# Patient Record
Sex: Male | Born: 1985 | Race: White | Hispanic: No | Marital: Single | Smoking: Current every day smoker
Health system: Southern US, Community
[De-identification: ages and names within clinical notes are randomized; demographics above are authoritative.]

## PROBLEM LIST (undated history)

## (undated) DIAGNOSIS — J45909 Unspecified asthma, uncomplicated: Secondary | ICD-10-CM

## (undated) DIAGNOSIS — R768 Other specified abnormal immunological findings in serum: Secondary | ICD-10-CM

## (undated) DIAGNOSIS — R413 Other amnesia: Secondary | ICD-10-CM

## (undated) DIAGNOSIS — S069XAA Unspecified intracranial injury with loss of consciousness status unknown, initial encounter: Secondary | ICD-10-CM

## (undated) DIAGNOSIS — G9389 Other specified disorders of brain: Secondary | ICD-10-CM

## (undated) DIAGNOSIS — Z87820 Personal history of traumatic brain injury: Secondary | ICD-10-CM

## (undated) DIAGNOSIS — A630 Anogenital (venereal) warts: Secondary | ICD-10-CM

## (undated) DIAGNOSIS — Z8669 Personal history of other diseases of the nervous system and sense organs: Secondary | ICD-10-CM

## (undated) DIAGNOSIS — J45998 Other asthma: Secondary | ICD-10-CM

## (undated) DIAGNOSIS — R519 Headache, unspecified: Secondary | ICD-10-CM

## (undated) DIAGNOSIS — R51 Headache: Secondary | ICD-10-CM

## (undated) DIAGNOSIS — R569 Unspecified convulsions: Secondary | ICD-10-CM

## (undated) HISTORY — DX: Unspecified intracranial injury with loss of consciousness status unknown, initial encounter: S06.9XAA

## (undated) HISTORY — DX: Unspecified asthma, uncomplicated: J45.909

## (undated) HISTORY — PX: TESTICLE SURGERY: SHX794

---
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
## 2005-09-01 ENCOUNTER — Emergency Department (HOSPITAL_COMMUNITY): Admission: EM | Admit: 2005-09-01 | Discharge: 2005-09-01 | Payer: Self-pay | Admitting: Emergency Medicine

## 2008-06-14 ENCOUNTER — Emergency Department (HOSPITAL_COMMUNITY): Admission: EM | Admit: 2008-06-14 | Discharge: 2008-06-14 | Payer: Self-pay | Admitting: Emergency Medicine

## 2008-12-18 ENCOUNTER — Emergency Department (HOSPITAL_COMMUNITY): Admission: EM | Admit: 2008-12-18 | Discharge: 2008-12-18 | Payer: Self-pay | Admitting: Family Medicine

## 2010-04-02 ENCOUNTER — Emergency Department (HOSPITAL_COMMUNITY): Admission: EM | Admit: 2010-04-02 | Discharge: 2010-04-03 | Payer: Self-pay | Admitting: Emergency Medicine

## 2010-12-11 ENCOUNTER — Emergency Department (HOSPITAL_COMMUNITY): Payer: Self-pay

## 2010-12-11 ENCOUNTER — Emergency Department (HOSPITAL_COMMUNITY)
Admission: EM | Admit: 2010-12-11 | Discharge: 2010-12-11 | Disposition: A | Discharge status: Home / Self Care | Payer: Self-pay | Attending: Emergency Medicine | Admitting: Emergency Medicine

## 2010-12-11 ENCOUNTER — Encounter (HOSPITAL_COMMUNITY): Payer: Self-pay | Admitting: Radiology

## 2010-12-11 DIAGNOSIS — H9319 Tinnitus, unspecified ear: Secondary | ICD-10-CM | POA: Insufficient documentation

## 2010-12-11 DIAGNOSIS — G43909 Migraine, unspecified, not intractable, without status migrainosus: Secondary | ICD-10-CM | POA: Insufficient documentation

## 2010-12-11 DIAGNOSIS — F17200 Nicotine dependence, unspecified, uncomplicated: Secondary | ICD-10-CM | POA: Insufficient documentation

## 2011-01-29 ENCOUNTER — Emergency Department (HOSPITAL_COMMUNITY)
Admission: EM | Admit: 2011-01-29 | Discharge: 2011-01-29 | Disposition: A | Discharge status: Home / Self Care | Payer: Self-pay | Attending: Emergency Medicine | Admitting: Emergency Medicine

## 2011-01-29 ENCOUNTER — Encounter (HOSPITAL_COMMUNITY): Payer: Self-pay | Admitting: *Deleted

## 2011-01-29 DIAGNOSIS — K029 Dental caries, unspecified: Secondary | ICD-10-CM | POA: Insufficient documentation

## 2011-01-29 DIAGNOSIS — K047 Periapical abscess without sinus: Secondary | ICD-10-CM | POA: Insufficient documentation

## 2011-01-29 MED ORDER — HYDROCODONE-ACETAMINOPHEN 5-325 MG PO TABS: 2.0000 | ORAL_TABLET | ORAL | Status: AC | PRN

## 2011-01-29 MED ORDER — PENICILLIN V POTASSIUM 500 MG PO TABS: 500.0000 mg | ORAL_TABLET | Freq: Four times a day (QID) | ORAL | Status: AC

## 2011-01-29 NOTE — ED Notes (Signed)
Patient with facial swelling and toothache on right side of face, unable to eat due to toothache

## 2011-01-29 NOTE — ED Notes (Signed)
C/o right lower jaw swelling x 2 days; pt right lower wisdom tooth broken to gumline; multiple caries noted to surrounding teeth; pt has no Adult nurse.

## 2011-01-29 NOTE — ED Provider Notes (Signed)
History     CSN: [id number] Arrival date & time: 01/29/2011  9:28 PM  Chief Complaint  Patient presents with  . Dental Pain   HPI Comments: R sided facial swelling and pain in R lower molar x 3 days.  Pain increasing with eating. No drooling or difficulty swallowing.  No fever or vomiting.  Taking sister's penicillin.  Was told that he would probably need teeth pulled but has not seen a dentist recently.    Patient is a 25 y.o. male presenting with tooth pain. The history is provided by the patient.  Dental PainThe primary symptoms include mouth pain. Primary symptoms do not include headaches, fever, shortness of breath or cough. The symptoms began 3 to 5 days ago. The symptoms are unchanged. The symptoms are new. The symptoms occur constantly.  Additional symptoms include: jaw pain, facial swelling and trouble swallowing. Additional symptoms do not include: pain with swallowing and drooling.    History reviewed. No pertinent past medical history.  Past Surgical History  Procedure Date  . Head injury   . Mini stroke     History reviewed. No pertinent family history.  History  Substance Use Topics  . Smoking status: Former Games developer  . Smokeless tobacco: Not on file  . Alcohol Use: No      Review of Systems  Constitutional: Negative for fever.  HENT: Positive for facial swelling, trouble swallowing and dental problem. Negative for drooling.   Respiratory: Negative for cough and shortness of breath.   Cardiovascular: Negative for chest pain.  Gastrointestinal: Negative for nausea, vomiting and abdominal pain.  Genitourinary: Negative for dysuria.  Musculoskeletal: Negative for back pain.  Neurological: Negative for headaches.    Physical Exam  BP 111/69  Pulse 67  Temp(Src) 97.6 F (36.4 C) (Oral)  Resp 16  Ht 5\' 6"  (1.676 m)  Wt 140 lb (63.504 kg)  BMI 22.60 kg/m2  SpO2 100%  Physical Exam  Constitutional: He is oriented to person, place, and time. He appears  well-developed and well-nourished. No distress.  HENT:  Head: Normocephalic and atraumatic.  Mouth/Throat:         Slight R jaw swelling.  No periapical abscess, no fluctuance to buccal mucosa. Floor of mouth soft.  Tongue midline.  No trismus or drooling  Eyes: Conjunctivae are normal. Pupils are equal, round, and reactive to light.  Neck: Normal range of motion.  Cardiovascular: Normal rate, regular rhythm and normal heart sounds.   Pulmonary/Chest: Effort normal and breath sounds normal. No respiratory distress.  Abdominal: Soft. There is no tenderness. There is no rebound and no guarding.  Musculoskeletal: Normal range of motion. He exhibits no edema and no tenderness.  Neurological: He is alert and oriented to person, place, and time. No cranial nerve deficit.  Skin: Skin is warm.    ED Course  Procedures  MDM Dental abscess, caries. No respiratory distress or problem swallowing. Antibiotics, pain control, followup oral surgery.      Glynn Octave, MD 01/29/11 2300

## 2011-03-16 ENCOUNTER — Emergency Department (HOSPITAL_COMMUNITY): Payer: Self-pay

## 2011-03-16 ENCOUNTER — Emergency Department (HOSPITAL_COMMUNITY)
Admission: EM | Admit: 2011-03-16 | Discharge: 2011-03-16 | Discharge status: Against Medical Advice | Payer: Self-pay | Attending: Emergency Medicine | Admitting: Emergency Medicine

## 2011-03-16 ENCOUNTER — Encounter (HOSPITAL_COMMUNITY): Payer: Self-pay | Admitting: Emergency Medicine

## 2011-03-16 DIAGNOSIS — Z5320 Procedure and treatment not carried out because of patient's decision for unspecified reasons: Secondary | ICD-10-CM | POA: Insufficient documentation

## 2011-03-16 DIAGNOSIS — T148XXA Other injury of unspecified body region, initial encounter: Secondary | ICD-10-CM

## 2011-03-16 DIAGNOSIS — S31109A Unspecified open wound of abdominal wall, unspecified quadrant without penetration into peritoneal cavity, initial encounter: Secondary | ICD-10-CM | POA: Insufficient documentation

## 2011-03-16 LAB — TYPE AND SCREEN
ABO/RH(D): O POS
Antibody Screen: NEGATIVE

## 2011-03-16 LAB — CBC
HCT: 44.3 % (ref 39.0–52.0)
Hemoglobin: 15.5 g/dL (ref 13.0–17.0)
MCH: 31.1 pg (ref 26.0–34.0)
MCHC: 35 g/dL (ref 30.0–36.0)
MCV: 89 fL (ref 78.0–100.0)
Platelets: 269 10*3/uL (ref 150–400)
RBC: 4.98 MIL/uL (ref 4.22–5.81)
RDW: 12.5 % (ref 11.5–15.5)
WBC: 9.3 10*3/uL (ref 4.0–10.5)

## 2011-03-16 LAB — URINALYSIS, ROUTINE W REFLEX MICROSCOPIC
Bilirubin Urine: NEGATIVE
Glucose, UA: NEGATIVE mg/dL
Ketones, ur: NEGATIVE mg/dL
Leukocytes, UA: NEGATIVE
Protein, ur: NEGATIVE mg/dL
Urobilinogen, UA: 0.2 mg/dL (ref 0.0–1.0)
pH: 8.5 — ABNORMAL HIGH (ref 5.0–8.0)

## 2011-03-16 LAB — BASIC METABOLIC PANEL
BUN: 10 mg/dL (ref 6–23)
CO2: 26 mEq/L (ref 19–32)
Calcium: 9.7 mg/dL (ref 8.4–10.5)
Creatinine, Ser: 0.92 mg/dL (ref 0.50–1.35)
GFR calc Af Amer: >60 mL/min (ref >60)
GFR calc non Af Amer: >60 mL/min (ref >60)
Glucose, Bld: 92 mg/dL (ref 70–99)
Sodium: 135 mEq/L (ref 135–145)

## 2011-03-16 LAB — URINE MICROSCOPIC-ADD ON

## 2011-03-16 LAB — PROTIME-INR
INR: 0.98 (ref 0.00–1.49)
Prothrombin Time: 13.2 seconds (ref 11.6–15.2)

## 2011-03-16 MED ORDER — HALOPERIDOL LACTATE 5 MG/ML IJ SOLN
5.0000 mg | Freq: Once | INTRAMUSCULAR | Status: AC
  Administered 2011-03-16: 5 mg via INTRAMUSCULAR
  Filled 2011-03-16: qty 1

## 2011-03-16 MED ORDER — LORAZEPAM 2 MG/ML IJ SOLN
INTRAMUSCULAR | Status: AC
  Filled 2011-03-16: qty 1

## 2011-03-16 MED ORDER — CEFAZOLIN SODIUM 1-5 GM-% IV SOLN: 1.0000 g | Freq: Once | INTRAVENOUS | Status: DC

## 2011-03-16 MED ORDER — LORAZEPAM 2 MG/ML IJ SOLN
2.0000 mg | Freq: Once | INTRAMUSCULAR | Status: AC
  Administered 2011-03-16: 2 mg via INTRAMUSCULAR

## 2011-03-16 MED ORDER — TETANUS-DIPHTH-ACELL PERTUSSIS 5-2.5-18.5 LF-MCG/0.5 IM SUSP
0.5000 mL | Freq: Once | INTRAMUSCULAR | Status: AC
  Administered 2011-03-16: 0.5 mL via INTRAMUSCULAR
  Filled 2011-03-16: qty 0.5

## 2011-03-16 MED ORDER — CEPHALEXIN 500 MG PO CAPS: 500.0000 mg | ORAL_CAPSULE | Freq: Four times a day (QID) | ORAL | Status: AC

## 2011-03-16 MED ORDER — SODIUM CHLORIDE 0.9 % IV BOLUS (SEPSIS): 1000.0000 mL | Freq: Once | INTRAVENOUS | Status: DC

## 2011-03-16 NOTE — ED Notes (Signed)
Patient requesting to go outside for "fresh air" Patient informed he is not allowed to leave ED while in process. Patient gave verbal understanding and returned to room.

## 2011-03-16 NOTE — ED Notes (Signed)
Wound dressed with sterile gauze

## 2011-03-16 NOTE — ED Notes (Signed)
Patient is resting comfortably. 

## 2011-03-16 NOTE — ED Notes (Signed)
Dr. Rancour at bedside. 
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
## 2011-03-16 NOTE — ED Notes (Signed)
Patient refusing IV. Dr. Manus Gunning aware and will be in to see patient.

## 2011-03-16 NOTE — ED Notes (Signed)
Pt ready to leave. Dr. Manus Gunning made aware and will be in to see patient soon.
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
## 2011-03-16 NOTE — ED Notes (Signed)
Patient still refusing IV. Benefits of IV explained by RN and MD to patient, still refusing IV. Haldol order received from MD.

## 2011-03-16 NOTE — ED Notes (Signed)
Dr. Manus Gunning in to speak with pt about benefits of IV. Patient still refusing IV. Patient is agreeable to CT without contrast after speaking with Dr. Manus Gunning. CT made aware.

## 2011-03-16 NOTE — ED Notes (Signed)
Patient left AMA. AMA form signed by patient. Chandra Batch, RN witnessed signature and informed patient of risks/benefits prior to obtaining signature. Patient left department with steady gait and in NAD at time of departure.

## 2014-02-21 ENCOUNTER — Emergency Department (HOSPITAL_COMMUNITY): Payer: Self-pay

## 2014-02-21 ENCOUNTER — Emergency Department (HOSPITAL_COMMUNITY)
Admission: EM | Admit: 2014-02-21 | Discharge: 2014-02-21 | Disposition: A | Discharge status: Home / Self Care | Payer: Self-pay | Attending: Emergency Medicine | Admitting: Emergency Medicine

## 2014-02-21 ENCOUNTER — Encounter (HOSPITAL_COMMUNITY): Payer: Self-pay | Admitting: Emergency Medicine

## 2014-02-21 DIAGNOSIS — F17200 Nicotine dependence, unspecified, uncomplicated: Secondary | ICD-10-CM | POA: Insufficient documentation

## 2014-02-21 DIAGNOSIS — J45909 Unspecified asthma, uncomplicated: Secondary | ICD-10-CM | POA: Insufficient documentation

## 2014-02-21 DIAGNOSIS — I10 Essential (primary) hypertension: Secondary | ICD-10-CM | POA: Insufficient documentation

## 2014-02-21 DIAGNOSIS — S0100XA Unspecified open wound of scalp, initial encounter: Secondary | ICD-10-CM | POA: Insufficient documentation

## 2014-02-21 DIAGNOSIS — S0101XA Laceration without foreign body of scalp, initial encounter: Secondary | ICD-10-CM

## 2014-02-21 DIAGNOSIS — IMO0002 Reserved for concepts with insufficient information to code with codable children: Secondary | ICD-10-CM | POA: Insufficient documentation

## 2014-02-21 DIAGNOSIS — S0990XA Unspecified injury of head, initial encounter: Secondary | ICD-10-CM | POA: Insufficient documentation

## 2014-02-21 MED ORDER — LIDOCAINE HCL (PF) 1 % IJ SOLN
5.0000 mL | Freq: Once | INTRAMUSCULAR | Status: AC
  Administered 2014-02-21: 5 mL
  Filled 2014-02-21: qty 5

## 2014-02-21 MED ORDER — HYDROMORPHONE HCL PF 1 MG/ML IJ SOLN
1.0000 mg | Freq: Once | INTRAMUSCULAR | Status: AC
  Administered 2014-02-21: 1 mg via INTRAMUSCULAR
  Filled 2014-02-21: qty 1

## 2014-02-21 MED ORDER — TETANUS-DIPHTH-ACELL PERTUSSIS 5-2.5-18.5 LF-MCG/0.5 IM SUSP
0.5000 mL | Freq: Once | INTRAMUSCULAR | Status: AC
  Administered 2014-02-21: 0.5 mL via INTRAMUSCULAR
  Filled 2014-02-21: qty 0.5

## 2014-02-21 MED ORDER — HYDROCODONE-ACETAMINOPHEN 5-325 MG PO TABS: 1.0000 | ORAL_TABLET | ORAL | Status: DC | PRN
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
## 2014-02-21 NOTE — Discharge Instructions (Signed)
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
## 2014-10-19 ENCOUNTER — Encounter: Payer: Self-pay | Admitting: *Deleted

## 2014-10-19 ENCOUNTER — Ambulatory Visit: Payer: Self-pay | Admitting: Family

## 2014-10-19 VITALS — BP 142/96 | HR 78 | Temp 97.4°F | Ht 68.0 in | Wt 147.6 lb

## 2014-10-19 DIAGNOSIS — N390 Urinary tract infection, site not specified: Secondary | ICD-10-CM

## 2014-10-19 DIAGNOSIS — B977 Papillomavirus as the cause of diseases classified elsewhere: Secondary | ICD-10-CM

## 2014-10-19 DIAGNOSIS — R10819 Abdominal tenderness, unspecified site: Secondary | ICD-10-CM

## 2014-10-19 DIAGNOSIS — N50811 Right testicular pain: Secondary | ICD-10-CM

## 2014-10-19 DIAGNOSIS — I10 Essential (primary) hypertension: Secondary | ICD-10-CM | POA: Insufficient documentation

## 2014-10-19 DIAGNOSIS — N508 Other specified disorders of male genital organs: Secondary | ICD-10-CM

## 2014-10-19 DIAGNOSIS — R350 Frequency of micturition: Secondary | ICD-10-CM

## 2014-10-19 DIAGNOSIS — S069X9A Unspecified intracranial injury with loss of consciousness of unspecified duration, initial encounter: Secondary | ICD-10-CM | POA: Insufficient documentation

## 2014-10-19 DIAGNOSIS — J45909 Unspecified asthma, uncomplicated: Secondary | ICD-10-CM | POA: Insufficient documentation

## 2014-10-19 DIAGNOSIS — S069XAA Unspecified intracranial injury with loss of consciousness status unknown, initial encounter: Secondary | ICD-10-CM | POA: Insufficient documentation

## 2014-10-19 LAB — POCT URINALYSIS DIPSTICK
Bilirubin, UA: NEGATIVE
Blood, UA: NEGATIVE
GLUCOSE UA: NEGATIVE
Ketones, UA: NEGATIVE
Leukocytes, UA: NEGATIVE
Nitrite, UA: NEGATIVE
SPEC GRAV UA: 1.015
UROBILINOGEN UA: NEGATIVE
pH, UA: 6.5

## 2014-10-19 LAB — POCT CBC
GRANULOCYTE PERCENT: 75.4 % (ref 37–80)
HEMATOCRIT: 50.7 % (ref 43.5–53.7)
HEMOGLOBIN: 16.3 g/dL (ref 14.1–18.1)
Lymph, poc: 2 (ref 0.6–3.4)
MCH, POC: 27.9 pg (ref 27–31.2)
MCHC: 32.1 g/dL (ref 31.8–35.4)
MCV: 87.1 fL (ref 80–97)
MPV: 5.7 fL (ref 0–99.8)
POC Granulocyte: 7.5 — AB (ref 2–6.9)
POC LYMPH PERCENT: 19.5 %L (ref 10–50)
Platelet Count, POC: 381 10*3/uL (ref 142–424)
RBC: 5.82 M/uL (ref 4.69–6.13)
RDW, POC: 13.5 %
WBC: 10 10*3/uL (ref 4.6–10.2)

## 2014-10-19 LAB — POCT UA - MICROSCOPIC ONLY
Bacteria, U Microscopic: NEGATIVE
CASTS, UR, LPF, POC: NEGATIVE
CRYSTALS, UR, HPF, POC: NEGATIVE
RBC, urine, microscopic: NEGATIVE
Yeast, UA: NEGATIVE

## 2014-10-19 MED ORDER — CIPROFLOXACIN HCL 500 MG PO TABS: 500.0000 mg | ORAL_TABLET | Freq: Two times a day (BID) | ORAL | Status: DC

## 2014-10-19 NOTE — Progress Notes (Signed)
Subjective:    Patient ID: Henry Gray, male    DOB: Dec 15, 1985, 29 y.o.   MRN: [id number]   Testicle Pain The patient's primary symptoms include penile pain, scrotal swelling and testicular pain. The patient's pertinent negatives include no genital injury, genital itching, genital lesions or penile discharge. This is a new problem. The current episode started in the past 7 days (Three days). The problem occurs intermittently. The problem has been unchanged. The pain is medium. Associated symptoms include abdominal pain, flank pain and frequency. Pertinent negatives include no constipation, diarrhea, discolored urine, dysuria, hematuria or hesitancy. The testicular pain affects the right testicle. There is swelling in the right testicle. The symptoms are aggravated by heavy lifting, tactile pressure and activity. 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    Review of Systems  Constitutional: Negative.   HENT: Negative.   Respiratory: Negative.   Cardiovascular: Negative.   Gastrointestinal: Positive for abdominal pain. Negative for diarrhea and constipation.  Endocrine: Negative.   Genitourinary: Positive for frequency, flank pain, scrotal swelling, penile pain and testicular pain. Negative for dysuria, hesitancy and discharge.  Musculoskeletal: Negative.   Neurological: Negative.   Hematological: Negative.   Psychiatric/Behavioral: Negative.   All other systems reviewed and are negative.      Objective:   Physical Exam  Constitutional: He is oriented to person, place, and time. He appears well-developed and well-nourished. No distress.  HENT:  Head: Normocephalic.  Eyes: Pupils are equal, round, and reactive to light. Right eye exhibits no discharge. Left eye exhibits no discharge.  Neck: Normal range of motion. Neck supple. No thyromegaly present.  Cardiovascular: Normal rate, regular rhythm, normal heart sounds and intact distal pulses.   No murmur  heard. Pulmonary/Chest: Effort normal and breath sounds normal. No respiratory distress. He has no wheezes.  Abdominal: Soft. Bowel sounds are normal. He exhibits no distension. There is tenderness (LLQ pain).  Genitourinary: Right testis shows tenderness.  Multiple HPV warts location on around penis and testicles    Musculoskeletal: Normal range of motion. He exhibits no edema or tenderness.  Mild CVA tenderness on left lower back   Neurological: He is alert and oriented to person, place, and time. He has normal reflexes. No cranial nerve deficit.  Skin: Skin is warm and dry. No rash noted. No erythema.  Psychiatric: He has a normal mood and affect. His behavior is normal. Judgment and thought content normal.  Vitals reviewed.  Tract marks present on both arms denies any IV drug use. Pt states he "smokes a lot of marijuana".   BP 142/96 mmHg  Pulse 78  Temp(Src) 97.4 F (36.3 C) (Oral)  Ht 5\' 8"  (1.727 m)  Wt 147 lb 9.6 oz (66.951 kg)  BMI 22.45 kg/m2     Assessment & Plan:  1. Right testicular pain - GC/Chlamydia Probe Amp - CMP14+EGFR - STD Screening Panel/High Risk  2. Abdominal tenderness - POCT CBC - POCT UA - Microscopic Only - GC/Chlamydia Probe Amp - CMP14+EGFR - STD Screening Panel/High Risk - POCT urinalysis dipstick  3. Urine frequency - POCT UA - Microscopic Only - POCT urinalysis dipstick - ciprofloxacin (CIPRO) 500 MG tablet; Take 1 tablet (500 mg total) by mouth 2 (two) times daily.  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 Urinary tract infection without hematuria, site unspecified - ciprofloxacin (CIPRO) 500 MG tablet; Take 1 tablet (500 mg total) by mouth 2 (two) times  daily.  Dispense: 28 tablet; Refill: 0  Pt needs to force fluids Labs pending- Will see if STD present? Pt to take Cipro for BID for 14 days -RTO in 2 weeks or if pain worsens -Discussed drug use, but pt denies  Evelina Dun, FNP

## 2014-10-19 NOTE — Patient Instructions (Addendum)
Genital Warts Genital warts are a sexually transmitted infection. They may appear as small bumps on the tissues of the genital area. CAUSES  Genital warts are caused by a virus called human papillomavirus (HPV). HPV is the most common sexually transmitted disease (STD) and infection of the sex organs. This infection is spread by having unprotected sex with an infected person. It can be spread by vaginal, anal, and oral sex. Many people do not know they are infected. They may be infected for years without problems. However, even if they do not have problems, they can unknowingly pass the infection to their sexual partners. SYMPTOMS   Itching and irritation in the genital area.  Warts that bleed.  Painful sexual intercourse. DIAGNOSIS  Warts are usually recognized with the naked eye on the vagina, vulva, perineum, anus, and rectum. Certain tests can also diagnose genital warts, such as:  A Pap test.  A tissue sample (biopsy) exam.  Colposcopy. A magnifying tool is used to examine the vagina and cervix. The HPV cells will change color when certain solutions are used. TREATMENT  Warts can be removed by:  Applying certain chemicals, such as cantharidin or podophyllin.  Liquid nitrogen freezing (cryotherapy).  Immunotherapy with Candida or Trichophyton injections.  Laser treatment.  Burning with an electrified probe (electrocautery).  Interferon injections.  Surgery. PREVENTION  HPV vaccination can help prevent HPV infections that cause genital warts and that cause cancer of the cervix. It is recommended that the vaccination be given to people between the ages 9 to 26 years old. The vaccine might not work as well or might not work at all if you already have HPV. It should not be given to pregnant women. HOME CARE INSTRUCTIONS   It is important to follow your caregiver's instructions. The warts will not go away without treatment. Repeat treatments are often needed to get rid of warts.  Even after it appears that the warts are gone, the normal tissue underneath often remains infected.  Do not try to treat genital warts with medicine used to treat hand warts. This type of medicine is strong and can burn the skin in the genital area, causing more damage.  Tell your past and current sexual partner(s) that you have genital warts. They may be infected also and need treatment.  Avoid sexual contact while being treated.  Do not touch or scratch the warts. The infection may spread to other parts of your body.  Women with genital warts should have a cervical cancer check (Pap test) at least once a year. This type of cancer is slow-growing and can be cured if found early. Chances of developing cervical cancer are increased with HPV.  Inform your obstetrician about your warts in the event of pregnancy. This virus can be passed to the baby's respiratory tract. Discuss this with your caregiver.  Use a condom during sexual intercourse. Following treatment, the use of condoms will help prevent reinfection.  Ask your caregiver about using over-the-counter anti-itch creams. SEEK MEDICAL CARE IF:   Your treated skin becomes red, swollen, or painful.  You have a fever.  You feel generally ill.  You feel little lumps in and around your genital area.  You are bleeding or have painful sexual intercourse. MAKE SURE YOU:   Understand these instructions.  Will watch your condition.  Will get help right away if you are not doing well or get worse. Document Released: 06/07/2000 Document Revised: 10/25/2013 Document Reviewed: 12/17/2010 ExitCare Patient Information 2015 [employer]. This   information is not intended to replace advice given to you by your health care provider. Make sure you discuss any questions you have with your health care provider. Urinary Tract Infection Urinary tract infections (UTIs) can develop anywhere along your urinary tract. Your urinary tract is your body's  drainage system for removing wastes and extra water. Your urinary tract includes two kidneys, two ureters, a bladder, and a urethra. Your kidneys are a pair of bean-shaped organs. Each kidney is about the size of your fist. They are located below your ribs, one on each side of your spine. CAUSES Infections are caused by microbes, which are microscopic organisms, including fungi, viruses, and bacteria. These organisms are so small that they can only be seen through a microscope. Bacteria are the microbes that most commonly cause UTIs. SYMPTOMS  Symptoms of UTIs may vary by age and gender of the patient and by the location of the infection. Symptoms in young women typically include a frequent and intense urge to urinate and a painful, burning feeling in the bladder or urethra during urination. Older women and men are more likely to be tired, shaky, and weak and have muscle aches and abdominal pain. A fever may mean the infection is in your kidneys. Other symptoms of a kidney infection include pain in your back or sides below the ribs, nausea, and vomiting. DIAGNOSIS To diagnose a UTI, your caregiver will ask you about your symptoms. Your caregiver also will ask to provide a urine sample. The urine sample will be tested for bacteria and white blood cells. White blood cells are made by your body to help fight infection. TREATMENT  Typically, UTIs can be treated with medication. Because most UTIs are caused by a bacterial infection, they usually can be treated with the use of antibiotics. The choice of antibiotic and length of treatment depend on your symptoms and the type of bacteria causing your infection. HOME CARE INSTRUCTIONS  If you were prescribed antibiotics, take them exactly as your caregiver instructs you. Finish the medication even if you feel better after you have only taken some of the medication.  Drink enough water and fluids to keep your urine clear or pale yellow.  Avoid caffeine, tea,  and carbonated beverages. They tend to irritate your bladder.  Empty your bladder often. Avoid holding urine for long periods of time.  Empty your bladder before and after sexual intercourse.  After a bowel movement, women should cleanse from front to back. Use each tissue only once. SEEK MEDICAL CARE IF:   You have back pain.  You develop a fever.  Your symptoms do not begin to resolve within 3 days. SEEK IMMEDIATE MEDICAL CARE IF:   You have severe back pain or lower abdominal pain.  You develop chills.  You have nausea or vomiting.  You have continued burning or discomfort with urination. MAKE SURE YOU:   Understand these instructions.  Will watch your condition.  Will get help right away if you are not doing well or get worse. Document Released: 03/20/2005 Document Revised: 12/10/2011 Document Reviewed: 07/19/2011 [facility] Patient Information 2015 [place], [place]. This information is not intended to replace advice given to you by your health care provider. Make sure you discuss any questions you have with your health care provider.

## 2014-10-20 LAB — CMP14+EGFR
ALT: 119 IU/L — AB (ref 0–44)
AST: 60 IU/L — AB (ref 0–40)
Albumin/Globulin Ratio: 1.1 (ref 1.1–2.5)
Albumin: 4.3 g/dL (ref 3.5–5.5)
Alkaline Phosphatase: 160 IU/L — ABNORMAL HIGH (ref 39–117)
BUN / CREAT RATIO: 10 (ref 8–19)
BUN: 8 mg/dL (ref 6–20)
Bilirubin Total: 0.6 mg/dL (ref 0.0–1.2)
CHLORIDE: 98 mmol/L (ref 97–108)
CO2: 22 mmol/L (ref 18–29)
Calcium: 9.2 mg/dL (ref 8.7–10.2)
Creatinine, Ser: 0.82 mg/dL (ref 0.76–1.27)
GFR calc Af Amer: 137 mL/min/{1.73_m2} (ref >59)
GFR calc non Af Amer: 119 mL/min/{1.73_m2} (ref >59)
GLOBULIN, TOTAL: 3.9 g/dL (ref 1.5–4.5)
Glucose: 67 mg/dL (ref 65–99)
POTASSIUM: 4 mmol/L (ref 3.5–5.2)
Sodium: 139 mmol/L (ref 134–144)
Total Protein: 8.2 g/dL (ref 6.0–8.5)

## 2014-10-20 LAB — STD SCREENING PANEL/HIGH RISK
HEP A IGM: NEGATIVE
HIV 1/HIV 2 AB: NONREACTIVE
HIV 1/O/2 Abs-Index Value: <1 (ref <1.00)
HSV 2 GLYCOPROTEIN G AB, IGG: 2.47 {index} — AB (ref 0.00–0.90)
Hep B C IgM: NEGATIVE
Hep C Virus Ab: >11 s/co ratio — ABNORMAL HIGH (ref 0.0–0.9)
Hepatitis B Surface Ag: NEGATIVE
RPR: NONREACTIVE

## 2014-10-21 ENCOUNTER — Other Ambulatory Visit: Payer: Self-pay | Admitting: Family

## 2014-10-21 DIAGNOSIS — B1920 Unspecified viral hepatitis C without hepatic coma: Secondary | ICD-10-CM

## 2014-10-21 LAB — GC/CHLAMYDIA PROBE AMP
Chlamydia trachomatis, NAA: NEGATIVE
Neisseria gonorrhoeae by PCR: NEGATIVE

## 2014-10-28 ENCOUNTER — Telehealth: Payer: Self-pay | Admitting: Family

## 2014-10-28 NOTE — Telephone Encounter (Signed)
Patient aware that he will have to come to the office to review labs. Patient states that he will be here before 4:30 today.

## 2014-10-31 ENCOUNTER — Other Ambulatory Visit: Payer: Self-pay | Admitting: Family

## 2014-10-31 DIAGNOSIS — B1920 Unspecified viral hepatitis C without hepatic coma: Secondary | ICD-10-CM

## 2014-10-31 DIAGNOSIS — R748 Abnormal levels of other serum enzymes: Secondary | ICD-10-CM

## 2015-01-02 ENCOUNTER — Telehealth: Payer: Self-pay | Admitting: Family

## 2015-01-03 NOTE — Telephone Encounter (Signed)
Left detailed mssg that there was a GI referral in the computer back in May that was noted as denied, but no notes as to why, Lennox GrumblesDebbie Boles will be working on this again, left her direct line and name for Mr. Urbanek to check on this referral if he does not here from her.

## 2015-04-04 ENCOUNTER — Telehealth: Payer: Self-pay | Admitting: Family

## 2015-06-29 ENCOUNTER — Telehealth: Payer: Self-pay | Admitting: *Deleted

## 2015-06-29 DIAGNOSIS — B1920 Unspecified viral hepatitis C without hepatic coma: Secondary | ICD-10-CM

## 2015-06-29 NOTE — Telephone Encounter (Signed)
Patient diagnosed with Hep C 09/2014. He has been unable to see anyone because of lack of finances and not having insurance.  He was referred to Hep C clinic after diagnoses but mom said that they turned him away because he didn't have insurance.  He was approved for financial aid through [facility] yesterday and that office advised them to schedule an appt with [facility].  A referral was placed and mom aware that it will take a few days for the appt to be scheduled.  They will follow up if they haven't heard anything next week.

## 2015-07-03 ENCOUNTER — Encounter: Payer: Self-pay | Admitting: Gastroenterology

## 2015-07-25 ENCOUNTER — Encounter: Payer: Self-pay | Admitting: Gastroenterology

## 2015-07-25 ENCOUNTER — Ambulatory Visit (INDEPENDENT_AMBULATORY_CARE_PROVIDER_SITE_OTHER): Payer: Self-pay | Admitting: Gastroenterology

## 2015-07-25 VITALS — BP 113/71 | HR 71 | Temp 97.8°F | Ht 67.0 in | Wt 162.0 lb

## 2015-07-25 DIAGNOSIS — R894 Abnormal immunological findings in specimens from other organs, systems and tissues: Secondary | ICD-10-CM

## 2015-07-25 DIAGNOSIS — R945 Abnormal results of liver function studies: Secondary | ICD-10-CM

## 2015-07-25 DIAGNOSIS — R768 Other specified abnormal immunological findings in serum: Secondary | ICD-10-CM

## 2015-07-25 DIAGNOSIS — R7989 Other specified abnormal findings of blood chemistry: Secondary | ICD-10-CM

## 2015-07-25 NOTE — Patient Instructions (Signed)
   Please have your labs done.   Please read over the [facility] patient information book and let me know if you have any questions.   Do not share needles, razors, nail clippers, toothbrushes. Use barrier protection (condoms) if you are sexually active.  Hepatitis C Hepatitis C is a viral infection of the liver. It can lead to scarring of the liver (cirrhosis), liver failure, or liver cancer. Hepatitis C may go undetected for months or years because people with the infection may not have symptoms, or they may have only mild symptoms. CAUSES  Hepatitis C is caused by the hepatitis C virus (HCV). The virus can be passed from one person to another through:  Blood.  Contaminated needles, such as those used for tattooing, body piercing, acupuncture, or injecting drugs.  Having unprotected sex with an infected person.  Childbirth.  Blood transfusions or organ transplants done in the [place] before 1992. RISK FACTORS Risk factors for hepatitis C include:  Having unprotected sex with an infected person.  Using illegal drugs. SIGNS AND SYMPTOMS  Symptoms of hepatitis C may include:  Fatigue.  Loss of appetite.  Nausea.  Vomiting.  Abdominal pain.  Dark yellow urine.  Yellowish skin and eyes (jaundice).  Itching of the skin.  Clay-colored bowel movements.  Joint pain. Symptoms are not always present.  DIAGNOSIS  Hepatitis C is diagnosed with blood tests. Other types of tests may also be done to check how your liver is functioning. TREATMENT  Your health care provider may perform noninvasive tests or a liver biopsy to help determine the best course of treatment. Treatment for hepatitis C may include one or more medicines. Your health care provider may check you for a recurring infection or other liver conditions every 6-12 months after treatment. HOME CARE INSTRUCTIONS   Rest as needed.  Take all medicines as directed by your health care provider.  Do not take any  medicine unless approved by your health care provider. This includes over-the-counter medicine and birth control pills.  Do not drink alcohol.  Do not have sex until approved by your health care provider.  Do not share toothbrushes, nail clippers, razors, or needles. PREVENTION There is no vaccine for hepatitis C. The only way to prevent the disease is to reduce the risk of exposure to the virus. This may be done by:  Practicing safe sex and using condoms.  Avoiding illegal drugs. SEEK MEDICAL CARE IF:  You have a fever.  You develop abdominal pain.  You develop dark urine.  You have clay-colored bowel movements.  You develop joint pains. SEEK IMMEDIATE MEDICAL CARE IF:  You have increasing fatigue or weakness.  You lose your appetite.  You feel nauseous or vomit.  You develop jaundice or your jaundice gets worse.  You bruise or bleed easily. MAKE SURE YOU:   Understand these instructions.  Will watch your condition.  Will get help right away if you are not doing well or get worse.   This information is not intended to replace advice given to you by your health care provider. Make sure you discuss any questions you have with your health care provider.   Document Released: 06/07/2000 Document Revised: 07/01/2014 Document Reviewed: 09/22/2013 Elsevier Interactive Patient Education [employer]
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
## 2015-07-26 ENCOUNTER — Encounter: Payer: Self-pay | Admitting: Gastroenterology

## 2015-07-26 NOTE — Progress Notes (Signed)
CC'D TO PCP °
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
## 2015-08-03 ENCOUNTER — Other Ambulatory Visit (HOSPITAL_COMMUNITY)
Admission: RE | Admit: 2015-08-03 | Discharge: 2015-08-03 | Disposition: A | Discharge status: Home / Self Care | Payer: Self-pay | Source: Ambulatory Visit | Attending: Gastroenterology | Admitting: Gastroenterology

## 2015-08-03 DIAGNOSIS — R7989 Other specified abnormal findings of blood chemistry: Secondary | ICD-10-CM | POA: Insufficient documentation

## 2015-08-03 DIAGNOSIS — R894 Abnormal immunological findings in specimens from other organs, systems and tissues: Secondary | ICD-10-CM | POA: Insufficient documentation

## 2015-08-03 LAB — COMPREHENSIVE METABOLIC PANEL
ALT: 14 U/L — ABNORMAL LOW (ref 17–63)
AST: 21 U/L (ref 15–41)
Albumin: 3.7 g/dL (ref 3.5–5.0)
Alkaline Phosphatase: 118 U/L (ref 38–126)
Anion gap: 7 (ref 5–15)
BUN: 9 mg/dL (ref 6–20)
CALCIUM: 8.9 mg/dL (ref 8.9–10.3)
CO2: 25 mmol/L (ref 22–32)
CREATININE: 0.88 mg/dL (ref 0.61–1.24)
Chloride: 104 mmol/L (ref 101–111)
GFR calc Af Amer: >60 mL/min (ref >60)
Glucose, Bld: 109 mg/dL — ABNORMAL HIGH (ref 65–99)
POTASSIUM: 3.7 mmol/L (ref 3.5–5.1)
Sodium: 136 mmol/L (ref 135–145)
Total Bilirubin: 0.4 mg/dL (ref 0.3–1.2)
Total Protein: 8 g/dL (ref 6.5–8.1)

## 2015-08-03 LAB — CBC WITH DIFFERENTIAL/PLATELET
BASOS ABS: 0 10*3/uL (ref 0.0–0.1)
Basophils Relative: 0 %
EOS PCT: 1 %
Eosinophils Absolute: 0.1 10*3/uL (ref 0.0–0.7)
HCT: 44.2 % (ref 39.0–52.0)
Hemoglobin: 15.5 g/dL (ref 13.0–17.0)
LYMPHS PCT: 19 %
Lymphs Abs: 1.7 10*3/uL (ref 0.7–4.0)
MCH: 29.7 pg (ref 26.0–34.0)
MCHC: 35.1 g/dL (ref 30.0–36.0)
MCV: 84.7 fL (ref 78.0–100.0)
MONO ABS: 0.6 10*3/uL (ref 0.1–1.0)
MONOS PCT: 7 %
Neutro Abs: 6.7 10*3/uL (ref 1.7–7.7)
Neutrophils Relative %: 73 %
PLATELETS: 312 10*3/uL (ref 150–400)
RBC: 5.22 MIL/uL (ref 4.22–5.81)
RDW: 12.7 % (ref 11.5–15.5)
WBC: 9 10*3/uL (ref 4.0–10.5)

## 2015-08-03 LAB — PROTIME-INR
INR: 1.11 (ref 0.00–1.49)
PROTHROMBIN TIME: 14.5 s (ref 11.6–15.2)

## 2015-08-04 LAB — HCV RNA QUANT RFLX ULTRA OR GENOTYP
HCV RNA Qnt(log copy/mL): 2.672 log10 IU/mL
HepC Qn: 470 IU/mL

## 2015-08-09 NOTE — Progress Notes (Signed)
Quick Note:  Can we please call the lab and get help interpreting these results? What is RTNI mean? This appears to be a positive result correct? ______

## 2015-08-17 NOTE — Progress Notes (Signed)
Quick Note:  Forwarding to Tana Coast, PA. ______

## 2015-08-17 NOTE — Progress Notes (Signed)
Quick Note:  I called Bianca E at [facility] and she said the RTNI only meant that the results were not indicated. ______

## 2015-08-28 ENCOUNTER — Telehealth: Payer: Self-pay | Admitting: Gastroenterology

## 2015-08-28 NOTE — Telephone Encounter (Signed)
Routing to Tana CoastLeslie Lewis, PA for lab results.

## 2015-08-28 NOTE — Telephone Encounter (Signed)
Patient called to say that he had a test done (I couldn't make out what he was saying) and it's been over a month per patient and was calling to check on the results. Please call him at [phone] (bad reception on the phone)

## 2015-08-29 ENCOUNTER — Other Ambulatory Visit: Payer: Self-pay

## 2015-08-29 DIAGNOSIS — B1920 Unspecified viral hepatitis C without hepatic coma: Secondary | ICD-10-CM

## 2015-08-29 NOTE — Telephone Encounter (Signed)
Please see result note 

## 2015-08-29 NOTE — Progress Notes (Signed)
Quick Note:  Please let patient know that his HCV viral load is positive but it was too low to genotype (has to be greater than 1000 to get a genotype from this lab).  What I suggest is trying [facility], HCV genotype. Go ahead with u/s with elastography in preparation for HCV treatment. ______

## 2015-08-29 NOTE — Progress Notes (Signed)
Quick Note:  PT is aware. [facility]. Routing to [facility] to schedule the US with elastography. ______

## 2015-08-30 ENCOUNTER — Other Ambulatory Visit: Payer: Self-pay

## 2015-08-30 DIAGNOSIS — K7460 Unspecified cirrhosis of liver: Secondary | ICD-10-CM

## 2015-08-30 NOTE — Telephone Encounter (Signed)
noted 

## 2015-08-30 NOTE — Telephone Encounter (Signed)
Pt returned Henry Gray's call about his US and I gave him date and time. He said he will have to change it and I gave him number to call at the hospital to reschedule. He will try to go to the lab soon [facility]

## 2015-09-05 ENCOUNTER — Ambulatory Visit (HOSPITAL_COMMUNITY): Admission: RE | Admit: 2015-09-05 | Payer: Self-pay | Source: Ambulatory Visit

## 2015-09-07 ENCOUNTER — Telehealth: Payer: Self-pay
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
## 2015-09-13 ENCOUNTER — Telehealth: Payer: Self-pay | Admitting: Gastroenterology

## 2015-09-13 ENCOUNTER — Ambulatory Visit (HOSPITAL_COMMUNITY)
Admission: RE | Admit: 2015-09-13 | Discharge: 2015-09-13 | Disposition: A | Discharge status: Home / Self Care | Payer: Self-pay | Source: Ambulatory Visit | Attending: Gastroenterology | Admitting: Gastroenterology

## 2015-09-13 DIAGNOSIS — K7460 Unspecified cirrhosis of liver: Secondary | ICD-10-CM | POA: Insufficient documentation
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
## 2015-09-13 NOTE — Telephone Encounter (Signed)
Pt had ultrasound done today and was going to have his labs done, but the lab said they didn't have his orders. Please advise.

## 2015-09-17 NOTE — Progress Notes (Signed)
Quick Note:  Please find out if patient fasted for his u/s. If not, then I suspect F3/F4 is falsely high.  Please find out if he [facility] as recommended. ______

## 2015-09-19 ENCOUNTER — Other Ambulatory Visit (HOSPITAL_COMMUNITY)
Admission: RE | Admit: 2015-09-19 | Discharge: 2015-09-19 | Disposition: A | Discharge status: Home / Self Care | Payer: Self-pay | Source: Ambulatory Visit | Attending: Gastroenterology | Admitting: Gastroenterology

## 2015-09-19 DIAGNOSIS — B1920 Unspecified viral hepatitis C without hepatic coma: Secondary | ICD-10-CM | POA: Insufficient documentation

## 2015-09-20 NOTE — Progress Notes (Signed)
Quick Note:  LMOM to call. ______ 

## 2015-09-25 NOTE — Progress Notes (Signed)
Quick Note:  Pt said he had fasted for 10-12 hours for the US. He also has had the labs drawn for HCV Genotype. ______

## 2015-09-25 NOTE — Progress Notes (Signed)
Quick Note:  LMOM to call. ______ 

## 2015-09-27 LAB — HEPATITIS C GENOTYPE

## 2015-09-27 NOTE — Progress Notes (Signed)
Quick Note:  . ______ 

## 2015-09-27 NOTE — Progress Notes (Signed)
Quick Note:  Need start start approval process for Harvoni. Please let patient know we got what we needed out of this lab, genotype 1a. ______

## 2015-09-28 NOTE — Progress Notes (Signed)
Quick Note:  LMOM to call. ______ 

## 2015-10-02 NOTE — Progress Notes (Signed)
Quick Note:    Noted    ______

## 2015-11-17 ENCOUNTER — Ambulatory Visit (INDEPENDENT_AMBULATORY_CARE_PROVIDER_SITE_OTHER): Payer: Self-pay | Admitting: Urology

## 2015-11-17 DIAGNOSIS — A630 Anogenital (venereal) warts: Secondary | ICD-10-CM

## 2015-11-28 ENCOUNTER — Telehealth: Payer: Self-pay

## 2015-11-28 ENCOUNTER — Other Ambulatory Visit: Payer: Self-pay | Admitting: Urology

## 2015-11-28 NOTE — Telephone Encounter (Signed)
Received a fax from the harvoni pt assistance co. They need income verification for the pt. Pt does not work and is supported by his father. I called [employer] pt assistance, A letter from the pts father was sent to them originally, but the father has the same name as the pt and this caused some confusion. I have gotten this straightened out but per Dylan at [employer] [facility], since the pt and his father have the same name and he is supported by his fater, they need one of them to call them and verbally tell them that the father does not claim the patient on his taxes. I have called the pt and explained this to him, given him the phone number to call and he said he will have his father call today.   Routing to LSL for FYI.

## 2015-11-29 NOTE — Telephone Encounter (Signed)
noted 

## 2015-12-06 ENCOUNTER — Encounter (HOSPITAL_BASED_OUTPATIENT_CLINIC_OR_DEPARTMENT_OTHER): Payer: Self-pay | Admitting: *Deleted

## 2015-12-11 ENCOUNTER — Encounter (HOSPITAL_BASED_OUTPATIENT_CLINIC_OR_DEPARTMENT_OTHER): Payer: Self-pay | Admitting: *Deleted

## 2015-12-11 NOTE — Progress Notes (Signed)
NPO AFTER MN.  ARRIVE AT 0715.  NEEDS HG. 

## 2015-12-11 NOTE — H&P (Signed)
Active Problems Problems  1. 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 Jorja Loaafeen for genital warts.  He has warts for 15 yrs and has been treated with topical agents.  He now has extensive lesions on the penis at the base and on the inner thighs.  He has no voiding complaints or anal symptoms.   Past Medical History Problems  1. History of Brain injury (J19.1Y7W(S06.9X9A) 2. History of asthma (Z87.09) 3. History of hypertension (Z86.79)  Surgical History Problems  1. History of Brain Surgery  Current Meds 1. Aspirin TABS;  Therapy: (Recorded:26May2017) to Recorded 2. Melatonin TABS;  Therapy: (Recorded:26May2017) to Recorded  Allergies Medication  1. No Known Drug Allergies  Family History Problems  1. Family history of Crohn's disease (Z83.79) 2. Family history of liver disease (Z83.79)  Social History Problems    Alcohol use (Z78.9)   Caffeine use (F15.90)   Single   Smokes cigarettes (F17.210)   Tobacco use (Z72.0)  Review of Systems Genitourinary, constitutional, skin, eye, otolaryngeal, hematologic/lymphatic, cardiovascular, pulmonary, endocrine, musculoskeletal, gastrointestinal, neurological and psychiatric system(s) were reviewed and pertinent findings if present are noted and are otherwise negative.  Genitourinary: penile lesion.  Musculoskeletal: back pain.  Neurological: headache.    Vitals Vital Signs [Data Includes: Last 1 Day]  Recorded: 26May2017 08:39AM  Height: 5 ft 10 in Weight: 140 lb  BMI Calculated: 20.09 BSA Calculated: 1.79 Blood Pressure: 111 / 69 Temperature: 98.5 F Heart Rate: 66  Physical Exam Constitutional: Well nourished and well developed . No acute distress.  ENT:. The ears and nose are normal in appearance.  Neck: The appearance of the neck is normal and no neck mass is present.  Pulmonary: No respiratory distress and normal respiratory rhythm and effort.   Cardiovascular: Heart rate and rhythm are normal . No peripheral edema.  Abdomen: The abdomen is soft and nontender. No masses are palpated. No CVA tenderness. No hernias are palpable. No hepatosplenomegaly noted.  Rectal: Rectal exam demonstrates the anus is normal on inspection. The perineum is normal on inspection.  Genitourinary: Examination of the penis demonstrates no discharge, no masses, no lesions and a normal meatus. The penis is uncircumcised. The scrotum is without lesions. The right epididymis is palpably normal and non-tender. The left epididymis is palpably normal and non-tender. The right testis is non-tender and without masses. The left testis is non-tender and without masses. 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 He has small lesions on the distal penile shaft.  Lymphatics: The femoral and inguinal nodes are not enlarged or tender.  Skin: no visible skin lesions . He has a 2cm subumbilical wart as well.  Neuro/Psych:. Mood and affect are appropriate.    Results/Data Urine [Data Includes: Last 1 Day]   26May2017  COLOR YELLOW   APPEARANCE CLEAR   SPECIFIC GRAVITY 1.030   pH 5.5   GLUCOSE NEGATIVE   BILIRUBIN NEGATIVE   KETONE NEGATIVE   BLOOD NEGATIVE   PROTEIN 1+   NITRITE NEGATIVE   LEUKOCYTE ESTERASE NEGATIVE   SQUAMOUS EPITHELIAL/HPF 0-5 HPF  WBC 0-5 WBC/HPF  RBC 0-2 RBC/HPF  BACTERIA FEW HPF  CRYSTALS NONE SEEN HPF  CASTS NONE  SEEN LPF  Other    Yeast NONE SEEN HPF   Old records or history reviewed: Records from Dr. Jorja Loa reviewed.  The following clinical lab reports were reviewed:  UA reviewed.    Assessment Assessed  1. 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   Plan Genital warts  1. UA With REFLEX; [Do Not Release]; Status:Hold For - [employer];  Requested for:23May2017;  2. Follow-up Schedule Surgery Office  Follow-up  Status: Hold For - Appointment   Requested for: 26May2017  He will need a combination of excision and laser therapy for treatment and I have reviewed the risks of bleeding, infection, penile injury, scarring, swelling or numbness, recurrent or persistent lesions, thrombotic events and anesthetic risks.    I will get him set up in the near future.   Discussion/Summary CC: Dr. Janalyn Harder.

## 2015-12-12 ENCOUNTER — Encounter (HOSPITAL_BASED_OUTPATIENT_CLINIC_OR_DEPARTMENT_OTHER)
Admission: RE | Disposition: A | Discharge status: Home / Self Care | Payer: Self-pay | Source: Ambulatory Visit | Attending: Urology

## 2015-12-12 ENCOUNTER — Ambulatory Visit (HOSPITAL_BASED_OUTPATIENT_CLINIC_OR_DEPARTMENT_OTHER): Payer: Self-pay | Admitting: Anesthesiology

## 2015-12-12 ENCOUNTER — Ambulatory Visit (HOSPITAL_BASED_OUTPATIENT_CLINIC_OR_DEPARTMENT_OTHER)
Admission: RE | Admit: 2015-12-12 | Discharge: 2015-12-12 | Disposition: A | Discharge status: Home / Self Care | Payer: Self-pay | Source: Ambulatory Visit | Attending: Urology | Admitting: Urology

## 2015-12-12 ENCOUNTER — Encounter (HOSPITAL_BASED_OUTPATIENT_CLINIC_OR_DEPARTMENT_OTHER): Payer: Self-pay | Admitting: *Deleted

## 2015-12-12 DIAGNOSIS — I10 Essential (primary) hypertension: Secondary | ICD-10-CM | POA: Insufficient documentation

## 2015-12-12 DIAGNOSIS — A630 Anogenital (venereal) warts: Secondary | ICD-10-CM | POA: Insufficient documentation

## 2015-12-12 DIAGNOSIS — F17210 Nicotine dependence, cigarettes, uncomplicated: Secondary | ICD-10-CM | POA: Insufficient documentation

## 2015-12-12 HISTORY — DX: Other amnesia: R41.3

## 2015-12-12 HISTORY — DX: Other specified abnormal immunological findings in serum: R76.8

## 2015-12-12 HISTORY — DX: Anogenital (venereal) warts: A63.0

## 2015-12-12 HISTORY — PX: CONDYLOMA EXCISION/FULGURATION: SHX1389

## 2015-12-12 HISTORY — DX: Other specified disorders of brain: G93.89

## 2015-12-12 HISTORY — DX: Personal history of traumatic brain injury: Z87.820

## 2015-12-12 HISTORY — DX: Headache, unspecified: R51.9

## 2015-12-12 HISTORY — DX: Personal history of other diseases of the nervous system and sense organs: Z86.69

## 2015-12-12 HISTORY — DX: Headache: R51

## 2015-12-12 HISTORY — DX: Other asthma: J45.998

## 2015-12-12 LAB — POCT HEMOGLOBIN-HEMACUE: Hemoglobin: 13.3 g/dL (ref 13.0–17.0)

## 2015-12-12 SURGERY — REMOVAL, CONDYLOMA
Anesthesia: General | Site: Abdomen

## 2015-12-12 MED ORDER — CEPHALEXIN 500 MG PO CAPS: 500.0000 mg | ORAL_CAPSULE | Freq: Three times a day (TID) | ORAL | Status: DC

## 2015-12-12 MED ORDER — FENTANYL CITRATE (PF) 100 MCG/2ML IJ SOLN
INTRAMUSCULAR | Status: AC
  Filled 2015-12-12: qty 4

## 2015-12-12 MED ORDER — CEFAZOLIN SODIUM-DEXTROSE 2-4 GM/100ML-% IV SOLN
INTRAVENOUS | Status: AC
  Filled 2015-12-12: qty 100

## 2015-12-12 MED ORDER — MIDAZOLAM HCL 2 MG/2ML IJ SOLN
INTRAMUSCULAR | Status: AC
Start: 2015-12-12 — End: 2015-12-12
  Filled 2015-12-12: qty 2

## 2015-12-12 MED ORDER — STERILE WATER FOR IRRIGATION IR SOLN
Status: DC | PRN
  Administered 2015-12-12: 500 mL

## 2015-12-12 MED ORDER — ONDANSETRON HCL 4 MG/2ML IJ SOLN
INTRAMUSCULAR | Status: DC | PRN
  Administered 2015-12-12: 4 mg via INTRAVENOUS

## 2015-12-12 MED ORDER — DEXAMETHASONE SODIUM PHOSPHATE 10 MG/ML IJ SOLN
INTRAMUSCULAR | Status: AC
  Filled 2015-12-12: qty 1

## 2015-12-12 MED ORDER — PROPOFOL 10 MG/ML IV BOLUS
INTRAVENOUS | Status: AC
  Filled 2015-12-12: qty 20

## 2015-12-12 MED ORDER — LIDOCAINE HCL (CARDIAC) 20 MG/ML IV SOLN
INTRAVENOUS | Status: DC | PRN
  Administered 2015-12-12: 100 mg via INTRAVENOUS

## 2015-12-12 MED ORDER — OXYCODONE HCL 5 MG PO TABS
5.0000 mg | ORAL_TABLET | Freq: Once | ORAL | Status: DC | PRN
  Filled 2015-12-12: qty 2

## 2015-12-12 MED ORDER — LACTATED RINGERS IV SOLN
INTRAVENOUS | Status: DC
  Administered 2015-12-12 (×2): via INTRAVENOUS
  Filled 2015-12-12: qty 1000

## 2015-12-12 MED ORDER — FENTANYL CITRATE (PF) 100 MCG/2ML IJ SOLN
INTRAMUSCULAR | Status: DC | PRN
  Administered 2015-12-12 (×2): 50 ug via INTRAVENOUS

## 2015-12-12 MED ORDER — OXYCODONE HCL 5 MG/5ML PO SOLN
5.0000 mg | Freq: Once | ORAL | Status: DC | PRN
  Filled 2015-12-12: qty 5

## 2015-12-12 MED ORDER — CEFAZOLIN SODIUM-DEXTROSE 2-4 GM/100ML-% IV SOLN
2.0000 g | INTRAVENOUS | Status: AC
  Administered 2015-12-12: 2 g via INTRAVENOUS
  Filled 2015-12-12: qty 100

## 2015-12-12 MED ORDER — BUPIVACAINE HCL (PF) 0.5 % IJ SOLN
INTRAMUSCULAR | Status: DC | PRN
  Administered 2015-12-12: 18 mL

## 2015-12-12 MED ORDER — PROPOFOL 500 MG/50ML IV EMUL
INTRAVENOUS | Status: DC | PRN
  Administered 2015-12-12: 4 mL via INTRAVENOUS
  Administered 2015-12-12: 20 mL via INTRAVENOUS
  Administered 2015-12-12 (×2): 4 mL via INTRAVENOUS
  Administered 2015-12-12: 2 mL via INTRAVENOUS

## 2015-12-12 MED ORDER — HYDROCODONE-ACETAMINOPHEN 5-325 MG PO TABS: 1.0000 | ORAL_TABLET | Freq: Four times a day (QID) | ORAL | Status: DC | PRN

## 2015-12-12 MED ORDER — TRAMADOL HCL 50 MG PO TABS: 50.0000 mg | ORAL_TABLET | Freq: Four times a day (QID) | ORAL | Status: DC | PRN

## 2015-12-12 MED ORDER — BACITRACIN-NEOMYCIN-POLYMYXIN 400-5-5000 EX OINT
TOPICAL_OINTMENT | CUTANEOUS | Status: DC | PRN
  Administered 2015-12-12: 1 via TOPICAL

## 2015-12-12 MED ORDER — DEXAMETHASONE SODIUM PHOSPHATE 10 MG/ML IJ SOLN
INTRAMUSCULAR | Status: DC | PRN
  Administered 2015-12-12: 10 mg via INTRAVENOUS

## 2015-12-12 MED ORDER — MIDAZOLAM HCL 5 MG/5ML IJ SOLN
INTRAMUSCULAR | Status: DC | PRN
  Administered 2015-12-12: 2 mg via INTRAVENOUS

## 2015-12-12 MED ORDER — ONDANSETRON HCL 4 MG/2ML IJ SOLN
INTRAMUSCULAR | Status: AC
  Filled 2015-12-12: qty 2

## 2015-12-12 MED ORDER — MIDAZOLAM HCL 2 MG/2ML IJ SOLN
INTRAMUSCULAR | Status: AC
  Filled 2015-12-12: qty 2

## 2015-12-12 SURGICAL SUPPLY — 25 items
BLADE 15 SAFETY STRL DISP (BLADE) ×6 IMPLANT
CLOTH BEACON ORANGE TIMEOUT ST (SAFETY) ×3 IMPLANT
DEPRESSOR TONGUE BLADE STERILE (MISCELLANEOUS) ×6 IMPLANT
GLOVE BIO SURGEON STRL SZ 6.5 (GLOVE) ×2 IMPLANT
GLOVE BIO SURGEONS STRL SZ 6.5 (GLOVE) ×1
GLOVE INDICATOR 6.5 STRL GRN (GLOVE) ×3 IMPLANT
GLOVE SURG SS PI 8.0 STRL IVOR (GLOVE) ×3 IMPLANT
GOWN STRL REUS W/ TWL LRG LVL3 (GOWN DISPOSABLE) ×1 IMPLANT
GOWN STRL REUS W/TWL LRG LVL3 (GOWN DISPOSABLE) ×3
GOWN W/COTTON TOWEL STD LRG (GOWNS) ×3 IMPLANT
GOWN XL W/COTTON TOWEL STD (GOWNS) ×3 IMPLANT
KIT ROOM TURNOVER WOR (KITS) ×3 IMPLANT
LIQUID BAND (GAUZE/BANDAGES/DRESSINGS) ×6 IMPLANT
NEEDLE HYPO 25X1 1.5 SAFETY (NEEDLE) ×3 IMPLANT
PACK BASIN DAY SURGERY FS (CUSTOM PROCEDURE TRAY) ×3 IMPLANT
SUT CHROMIC 3 0 SH 27 (SUTURE) ×3 IMPLANT
SUT MNCRL AB 3-0 PS2 18 (SUTURE) ×3 IMPLANT
SUT MNCRL AB 4-0 PS2 18 (SUTURE) ×6 IMPLANT
SUT MON AB 3-0 SH 27 (SUTURE) ×3
SUT MON AB 3-0 SH27 (SUTURE) ×1 IMPLANT
SYR CONTROL 10ML LL (SYRINGE) ×3 IMPLANT
TOWEL OR 17X24 6PK STRL BLUE (TOWEL DISPOSABLE) ×6 IMPLANT
TUBE CONNECTING 12'X1/4 (SUCTIONS)
TUBE CONNECTING 12X1/4 (SUCTIONS) IMPLANT
VACUUM HOSE 7/8X10 W/ WAND (MISCELLANEOUS) ×3 IMPLANT

## 2015-12-12 NOTE — Brief Op Note (Signed)
12/12/2015  11:02 AM  PATIENT:  Henry Gray  30 y.o. male  PRE-OPERATIVE DIAGNOSIS:  PENILE INTERTRIGONAL AND ABDOMINAL CONDYLOMA  POST-OPERATIVE DIAGNOSIS:  PENILE INTERTRIGONAL AND ABDOMINAL CONDYLOMA  PROCEDURE:  Procedure(s): EXCISION AND LASER ABLATION OF CONDYLOMA  (N/A)  SURGEON:  Surgeon(s) and Role:    * Bjorn PippinJohn Khari Lett, MD - Primary  PHYSICIAN ASSISTANT:   ASSISTANTS: none   ANESTHESIA:   local and general  EBL:  Total I/O In: 150 [I.V.:150] Out: 15 [Blood:15]  BLOOD ADMINISTERED:none  DRAINS: none   LOCAL MEDICATIONS USED:  MARCAINE     SPECIMEN:  Source of Specimen:  5 separate areas of condyloma were excised and sent  DISPOSITION OF SPECIMEN:  PATHOLOGY  COUNTS:  YES  TOURNIQUET:  * No tourniquets in log *  DICTATION: .Other Dictation: Dictation Number [id number]  PLAN OF CARE: Discharge to home after PACU  PATIENT DISPOSITION:  PACU - hemodynamically stable.   Delay start of Pharmacological VTE agent (>24hrs) due to surgical blood loss or risk of bleeding: not applicable

## 2015-12-12 NOTE — Discharge Instructions (Signed)
Activity:  You are encouraged to ambulate frequently (about every hour during waking hours) to help prevent blood clots from forming in your legs or lungs.  However, you should not engage in any heavy lifting (> 10-15 lbs), strenuous activity, or straining. No sexual activity or intercourse for 6 weeks  Diet: You may resume a regular diet.  Prescriptions:  Complete your full 5 day course of antibiotics (Keflex) to prevent a wound incision. You will be provided a prescription for pain medication to take as needed.  If your pain is not severe enough to require the prescription pain medication, you may take extra strength Tylenol instead which will have less side effects.  You should also take an over the counter stool softener to avoid straining with bowel movements as the prescription pain medication may constipate you. Do not drive while taking narcotic pain medications. Take Colace and/or Senna while taking narcotic pain medication. You may take over-the-counter Laxatives such as Miralax, Senna, or Milk of Magnesia. Stop taking these medications if you develop diarrhea.  Incisions: Apply bacitracin or neosporin ointment to the open lasered sites twice daily and after bathing x 7 days. The glue on the incisions will fall off on its own in 1-2 weeks and the sutures will absorb.  You may start showering (but not soaking or bathing in water) the 2nd day after surgery and the incisions simply need to be patted dry after the shower. No baths for 2 weeks. Do not scrub over the incisions, simply let soap and water run over them.  Do not soak the incision (such as a bath) for 2 weeks after surgery.    What to call us about: You should call the office [phone]) if you develop fever > 101, significant bleeding, develop persistent vomiting.  You may restart your aspirin as needed in 7 days.  Resume home medications as prescribed by your primary care physician.  It is normal to have significant swelling and  bruising of the scrotum and penis for 1-2 weeks following this surgery.  Post Anesthesia Home Care Instructions  Activity: Get plenty of rest for the remainder of the day. A responsible adult should stay with you for 24 hours following the procedure.  For the next 24 hours, DO NOT: -Drive a car -Advertising copywriterperate machinery -Drink alcoholic beverages -Take any medication unless instructed by your physician -Make any legal decisions or sign important papers.  Meals: Start with liquid foods such as gelatin or soup. Progress to regular foods as tolerated. Avoid greasy, spicy, heavy foods. If nausea and/or vomiting occur, drink only clear liquids until the nausea and/or vomiting subsides. Call your physician if vomiting continues.  Special Instructions/Symptoms: Your throat may feel dry or sore from the anesthesia or the breathing tube placed in your throat during surgery. If this causes discomfort, gargle with warm salt water. The discomfort should disappear within 24 hours.  If you had a scopolamine patch placed behind your ear for the management of post- operative nausea and/or vomiting:  1. The medication in the patch is effective for 72 hours, after which it should be removed.  Wrap patch in a tissue and discard in the trash. Wash hands thoroughly with soap and water. 2. You may remove the patch earlier than 72 hours if you experience unpleasant side effects which may include dry mouth, dizziness or visual disturbances. 3. Avoid touching the patch. Wash your hands with soap and water after contact with the patch.

## 2015-12-12 NOTE — Anesthesia Preprocedure Evaluation (Addendum)
Anesthesia Evaluation  Patient identified by MRN, date of birth, ID band Patient awake    Reviewed: Allergy & Precautions, NPO status , Patient's Chart, lab work & pertinent test results  Airway Mallampati: II  TM Distance: >3 FB Neck ROM: Full    Dental no notable dental hx.    Pulmonary asthma , Current Smoker,    Pulmonary exam normal breath sounds clear to auscultation       Cardiovascular hypertension, Normal cardiovascular exam Rhythm:Regular Rate:Normal     Neuro/Psych  Headaches, Seizures -,  Remote TBI negative psych ROS   GI/Hepatic negative GI ROS, (+)     substance abuse  IV drug use, Hepatitis -, CChristy A Hawks, FNP at 10/19/2014 noted bilateral track marks on pt's arms on clinic visit.  Pre-op pt. Dozing off during pre-op interview, denies any use of narcotic use this am.      Endo/Other  negative endocrine ROS  Renal/GU negative Renal ROS     Musculoskeletal negative musculoskeletal ROS (+)   Abdominal   Peds  Hematology negative hematology ROS (+)   Anesthesia Other Findings   Reproductive/Obstetrics                          Anesthesia Physical Anesthesia Plan  ASA: II  Anesthesia Plan: General   Post-op Pain Management:    Induction: Intravenous  Airway Management Planned: LMA  Additional Equipment:   Intra-op Plan:   Post-operative Plan: Extubation in OR  Informed Consent: I have reviewed the patients History and Physical, chart, labs and discussed the procedure including the risks, benefits and alternatives for the proposed anesthesia with the patient or authorized representative who has indicated his/her understanding and acceptance.   Dental advisory given  Plan Discussed with: CRNA  Anesthesia Plan Comments: (Pt adamantly denies IVDA. He, however, has clear track marks up his arms bilaterally. Discussed with Dr. Annabell HowellsWrenn.)       Anesthesia  Quick Evaluation

## 2015-12-12 NOTE — Op Note (Signed)
NAMEABDULKARIM, EBERLIN                 ACCOUNT NO.:  [id number]  MEDICAL RECORD NO.:  [id number]  LOCATION:                                 FACILITY:  PHYSICIAN:  Excell Seltzer. Annabell Howells, M.D.    DATE OF BIRTH:  May 16, 1986  DATE OF PROCEDURE:  12/12/2015 DATE OF DISCHARGE:                              OPERATIVE REPORT   PROCEDURE:  Excision and laser ablation of extensive condyloma acuminata.  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  SURGEON:  Excell Seltzer. Annabell Howells, M.D.  ANESTHESIA:  General.  SPECIMENS: 1. Periumbilical condyloma. 2. Right groin condyloma. 3. Left groin condyloma. 4. Left penile base condyloma. 5. Right penile base condyloma.  BLOOD LOSS:  Minimal.  COMPLICATIONS:  None.  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  He was referred by Dr. Jorja Loa for surgical management.  FINDINGS OF PROCEDURE:  He was given 2 g of Ancef.  He was taken to the operating room where general anesthetic was induced.  He was placed in the frog-leg position.  His genitalia was clipped and he was prepped with Betadine solution and draped in the usual sterile fashion.  The lesion just inferior to the umbilicus was approximately 3 cm in diameter and was excised in an elliptical fashion.  The specimen was handed off and the excision bed was fulgurated with Bovie for hemostasis and closed in two layers using interrupted 3-0 Monocryl on the subcutaneous layer and an intracuticular 3-0 Monocryl on the skin.  The wound was cleaned and reinforced with Dermabond.  There was a large lesion in the right groin that consisted of two patches, one approximately 4 cm and one 3 cm.  These were excised on a contiguous ellipse of  skin, 8 cm x 2 cm in size.  The site was cauterized for hemostasis and the wound was closed in two layers using interrupted 3-0 Monocryl on the subcutaneous layer and intracuticular 4- 0 Monocryl on the skin.  Once again, the wound was cleansed, dried and reinforced with LiquiBand.  A third lesion in the left groin was then excised with an approximately 4 cm elliptical patch of skin.  Once again, this was closed with a 3-0 Monocryl subcutaneous layer and a 4-0 Monocryl intracuticular layer. The wound was cleansed, dried and reinforced with LiquiBand.  A 2-cm patch at the base of the penis on the left was then excised in a 3-cm ellipse of skin.  This was closed in a single layer with an intracuticular 4-0 Monocryl after hemostasis was achieved.  The final patch approximately 3-4 cm in length was excised with a 4-cm skin incision at the right base of the penis.  Hemostasis was achieved and this was closed in a single layer with an intracuticular 4-0 Monocryl suture.  At this point, the CO2 laser was then used to ablate flat patches of condylomata adjacent to the right groin incision site and left groin incision site.  There were  additional condylomata on the base of the penis.  They were treated with the CO2 laser with a power of 10 watts.  They were adjacent to the right and left penile incisions.  An additional area on the right, distal penile shaft was ablated as well.  There were total of six lasered areas with diameters ranging from 1.5 to 2 cm.  Once all visible condyloma had either been excised or ablated, LiquiBand was used to secure the incisions that had not previously been treated and the various wounds were infiltrated with approximately 20 mL of 0.25% Marcaine.  The lasered sites were cleaned and coated with Neosporin ointment.  The patient's anesthetic was reversed and he was moved to the recovery room in stable condition.     Excell SeltzerJohn J. Annabell HowellsWrenn,  M.D.   ______________________________ Excell SeltzerJohn J. Annabell HowellsWrenn, M.D.    JJW/MEDQ  D:  12/12/2015  T:  12/12/2015  Job:  [id number]  cc:   Ria BushStuart O. Jorja Loaafeen, M.D. Fax: [phone]

## 2015-12-12 NOTE — Progress Notes (Signed)
Agitated on awakening in the OR.IV pulled out,took clothes off.Dr Renold DonGermeroth at bedside from the OR.Requests only monitor Oxygen saturation and heart rate -leave IV out for now due to agitation.Restart only if necessary.Pt calm,asleep on his right side.

## 2015-12-12 NOTE — Anesthesia Procedure Notes (Signed)
Procedure Name: LMA Insertion Date/Time: 12/12/2015 9:04 AM Performed by: Tyrone NineSAUVE, Cleora Karnik F Pre-anesthesia Checklist: Patient identified, Timeout performed, Emergency Drugs available, Suction available and Patient being monitored Patient Re-evaluated:Patient Re-evaluated prior to inductionOxygen Delivery Method: Circle system utilized Preoxygenation: Pre-oxygenation with 100% oxygen Intubation Type: IV induction Ventilation: Mask ventilation without difficulty LMA: LMA inserted LMA Size: 4.0 Number of attempts: 1 Placement Confirmation: positive ETCO2 and breath sounds checked- equal and bilateral Tube secured with: Tape Dental Injury: Teeth and Oropharynx as per pre-operative assessment

## 2015-12-12 NOTE — Interval H&P Note (Signed)
History and Physical Interval Note:  12/12/2015 8:33 AM  Henry Gray  has presented today for surgery, with the diagnosis of PENILE INTERTRIGONAL AND ABDOMINAL CONDYLOMA  The various methods of treatment have been discussed with the patient and family. After consideration of risks, benefits and other options for treatment, the patient has consented to  Procedure(s): EXCISION AND LASER ABLATION OF CONDYLOMA  (N/A) as a surgical intervention .  The patient's history has been reviewed, patient examined, no change in status, stable for surgery.  I have reviewed the patient's chart and labs.  Questions were answered to the patient's satisfaction.     Marciel Offenberger J

## 2015-12-12 NOTE — Transfer of Care (Signed)
Immediate Anesthesia Transfer of Care Note  Patient: Henry Gray  Procedure(s) Performed: Procedure(s): EXCISION AND LASER ABLATION OF CONDYLOMA  (N/A)  Patient Location: PACU  Anesthesia Type:General  Level of Consciousness: sedated and patient cooperative  Airway & Oxygen Therapy: Patient Spontanous Breathing and Patient connected to nasal cannula oxygen  Post-op Assessment: Report given to RN and Post -op Vital signs reviewed and stable  Post vital signs: Reviewed and stable  Last Vitals:  Filed Vitals:   12/12/15 0729  BP: 120/66  Pulse: 62  Temp: 36.9 C  Resp: 16    Last Pain: There were no vitals filed for this visit.    Patients Stated Pain Goal: 4 (12/12/15 0755)  Complications: No apparent anesthesia complications

## 2015-12-13 ENCOUNTER — Encounter (HOSPITAL_BASED_OUTPATIENT_CLINIC_OR_DEPARTMENT_OTHER): Payer: Self-pay | Admitting: Urology

## 2015-12-13 NOTE — Anesthesia Postprocedure Evaluation (Signed)
Anesthesia Post Note  Patient: Henry Gray  Procedure(s) Performed: Procedure(s) (LRB): EXCISION AND LASER ABLATION OF CONDYLOMA  (N/A)  Patient location during evaluation: PACU Anesthesia Type: General Level of consciousness: sedated and patient cooperative Pain management: pain level controlled Vital Signs Assessment: post-procedure vital signs reviewed and stable Respiratory status: spontaneous breathing Cardiovascular status: stable Anesthetic complications: no     Last Vitals:  Filed Vitals:   12/12/15 1115 12/12/15 1145  BP:  115/65  Pulse: 70 72  Temp:  36.3 C  Resp:  16    Last Pain:  Filed Vitals:   12/13/15 1310  PainSc: 7    Pain Goal: Patients Stated Pain Goal: 4 (12/12/15 0755)               Lewie LoronJohn Raeanne Deschler

## 2015-12-22 ENCOUNTER — Ambulatory Visit (INDEPENDENT_AMBULATORY_CARE_PROVIDER_SITE_OTHER): Payer: Self-pay | Admitting: Urology

## 2015-12-22 ENCOUNTER — Other Ambulatory Visit (HOSPITAL_COMMUNITY)
Admission: RE | Admit: 2015-12-22 | Discharge: 2015-12-22 | Disposition: A | Discharge status: Home / Self Care | Payer: Self-pay | Source: Other Acute Inpatient Hospital | Attending: Urology | Admitting: Urology

## 2015-12-22 DIAGNOSIS — B3749 Other urogenital candidiasis: Secondary | ICD-10-CM

## 2015-12-22 DIAGNOSIS — A630 Anogenital (venereal) warts: Secondary | ICD-10-CM | POA: Insufficient documentation

## 2015-12-22 LAB — URINALYSIS, ROUTINE W REFLEX MICROSCOPIC
Bilirubin Urine: NEGATIVE
GLUCOSE, UA: NEGATIVE mg/dL
Hgb urine dipstick: NEGATIVE
Ketones, ur: NEGATIVE mg/dL
LEUKOCYTES UA: NEGATIVE
NITRITE: NEGATIVE
PH: 6 (ref 5.0–8.0)
PROTEIN: NEGATIVE mg/dL
Specific Gravity, Urine: 1.02 (ref 1.005–1.030)

## 2015-12-27 ENCOUNTER — Telehealth: Payer: Self-pay | Admitting: *Deleted

## 2015-12-27 NOTE — Telephone Encounter (Signed)
See note of 11/28/2015. Also, I called and LMOM for a return call.

## 2015-12-27 NOTE — Telephone Encounter (Signed)
PATIENT CALLED INQUIRING ABOUT HIS MEDICATION FOR HIS CIRRHOSIS.  IT HAS BEEN ABOUT 6 WEEKS SINCE HE HAS HEARD ANYTHING

## 2016-01-02 ENCOUNTER — Ambulatory Visit (INDEPENDENT_AMBULATORY_CARE_PROVIDER_SITE_OTHER): Payer: Self-pay | Admitting: Urology

## 2016-01-02 DIAGNOSIS — N4889 Other specified disorders of penis: Secondary | ICD-10-CM

## 2016-01-02 DIAGNOSIS — R109 Unspecified abdominal pain: Secondary | ICD-10-CM

## 2016-01-03 ENCOUNTER — Ambulatory Visit: Payer: Self-pay | Admitting: Urology

## 2016-01-04 NOTE — Telephone Encounter (Signed)
[employer] pt assistance called to day and said pt has been approved for harvoni. They are going to fax us a form to fill out and they need an rx sent back. They are going to call and notify pt.

## 2016-01-10 ENCOUNTER — Ambulatory Visit: Payer: Self-pay | Admitting: Urology

## 2016-01-15 ENCOUNTER — Telehealth: Payer: Self-pay

## 2016-01-15 NOTE — Telephone Encounter (Signed)
received a call from [employer] (which is patient assistance for harvoni. Pt has been approved and we will receive his first shipment on 01/17/16. Per LSL, pt needs ov prior to picking up Harvoni.   Misty Stanley, please schedule.

## 2016-01-15 NOTE — Telephone Encounter (Signed)
MADE APPT FOR Thursday AND L/M ON PATIENT ANSWERING MACHINE.

## 2016-01-18 ENCOUNTER — Ambulatory Visit: Payer: Self-pay | Admitting: Gastroenterology

## 2016-01-22 ENCOUNTER — Ambulatory Visit: Payer: Self-pay | Admitting: Gastroenterology

## 2016-01-22 ENCOUNTER — Encounter: Payer: Self-pay | Admitting: Gastroenterology

## 2016-01-22 ENCOUNTER — Telehealth: Payer: Self-pay | Admitting: Gastroenterology

## 2016-01-22 NOTE — Telephone Encounter (Signed)
LETTER MAILED

## 2016-01-22 NOTE — Telephone Encounter (Signed)
Pt was a no show

## 2016-01-22 NOTE — Telephone Encounter (Signed)
Please have this patient follow-up to be seen with Verlon Au for [facility] treatment. His prescription is here in the office.

## 2016-01-29 NOTE — Telephone Encounter (Signed)
noted 

## 2016-02-02 ENCOUNTER — Ambulatory Visit: Payer: Self-pay | Admitting: Urology

## 2016-02-14 ENCOUNTER — Encounter: Payer: Self-pay | Admitting: Gastroenterology

## 2016-02-22 ENCOUNTER — Ambulatory Visit: Payer: Self-pay | Admitting: Gastroenterology

## 2016-03-12 ENCOUNTER — Ambulatory Visit (INDEPENDENT_AMBULATORY_CARE_PROVIDER_SITE_OTHER): Payer: Self-pay | Admitting: Gastroenterology

## 2016-03-12 ENCOUNTER — Encounter: Payer: Self-pay | Admitting: Gastroenterology

## 2016-03-12 DIAGNOSIS — B182 Chronic viral hepatitis C: Secondary | ICD-10-CM

## 2016-03-12 NOTE — Progress Notes (Signed)
      Primary Care Physician: Jannifer Rodneyhristy Hawks, FNP  Primary Gastroenterologist:  Jonette EvaSandi Fields, MD   Chief Complaint  Patient presents with  . Follow-up    HPI: Henry Gray is a 30 y.o. male here for follow up to start his Harvoni. We saw patient initially in 06/2015 for +HCV ab. At that time labs confirmed HCV, genotype 1a. U/s woth elastography with Metavir fibrosis score of some F3/F4. gb contracted, ?patient truly fasted and this could also affect F score.   Patient no showed 7/31. He tells me that he just got out of drug rehab. He did not admit to drug use when we saw him in 06/2015, stating he was ashamed. States he was in [facility] [place] Patient states he was placed with suicidal people. States he was given psychiatric medication and kept for 9 days. He report being off heroin for 61 days. Reports he did well initially but his after his grandfather passed away 12/25/15 he has been struggling due to depression and is afraid he is going to use again. He bought a suboxone off the street 2 weeks ago. He has no insurance. States he does not have money for outpatient rehab and refuses inpatient rehab.   Patient denies GI concerns. No abdominal pain, melena, brbpr, dysphagia, vomiting, heartburn, constipation, diarrhea.     No current outpatient prescriptions on file.   No current facility-administered medications for this visit.     Allergies as of 03/12/2016  . (No Known Allergies)    ROS:  General: Negative for anorexia, weight loss, fever, chills, fatigue, weakness. ENT: Negative for hoarseness, difficulty swallowing , nasal congestion. CV: Negative for chest pain, angina, palpitations, dyspnea on exertion, peripheral edema.  Respiratory: Negative for dyspnea at rest, dyspnea on exertion, cough, sputum, wheezing.  GI: See history of present illness. GU:  Negative for dysuria, hematuria, urinary incontinence, urinary frequency, nocturnal urination.  Endo: Negative for unusual  weight change.    Physical Examination:   BP (!) 136/92   Pulse 75   Temp 97.5 F (36.4 C) (Oral)   Ht 5\' 6"  (1.676 m)   Wt 155 lb 6.4 oz (70.5 kg)   BMI 25.08 kg/m   General: Well-nourished, well-developed in no acute distress.  Eyes: No icterus. Mouth: Oropharyngeal mucosa moist and pink , no lesions erythema or exudate. Lungs: Clear to auscultation bilaterally.  Heart: Regular rate and rhythm, no murmurs rubs or gallops.  Abdomen: Bowel sounds are normal, nontender, nondistended, no hepatosplenomegaly or masses, no abdominal bruits or hernia , no rebound or guarding.   Extremities: No lower extremity edema. No clubbing or deformities. Neuro: Alert and oriented x 4   Skin: Warm and dry, no jaundice.   Psych: Alert and cooperative, normal mood and affect.   Imaging Studies: No results found.

## 2016-03-16 DIAGNOSIS — B182 Chronic viral hepatitis C: Secondary | ICD-10-CM | POA: Insufficient documentation

## 2016-03-16 NOTE — Assessment & Plan Note (Signed)
30 y/o male with h/o chronic HCV presenting for follow up and to start his Harvoni regimen. 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 He was tearful in the hospital. Has tried arrange outpatient options on his own but does not have money to pursue and lack of insurance. We have provided patient with resource information for [facility] and [facility]. Patient will contact these services today. Print production plannerffice manager contacted [facility] for patient. Hold on Harvoni for now as patient needs to have drug problem under control.   Return to the office in 2 months for follow up.

## 2016-03-16 NOTE — Patient Instructions (Signed)
1. Return to the office in 2 months.  2. Contact [facility] today.

## 2016-03-18 ENCOUNTER — Encounter: Payer: Self-pay | Admitting: Gastroenterology

## 2016-03-18 NOTE — Progress Notes (Signed)
CC'D TO PCP °

## 2016-04-03 ENCOUNTER — Telehealth: Payer: Self-pay | Admitting: Gastroenterology

## 2016-04-03 NOTE — Telephone Encounter (Signed)
Spoke with Annamarie Majorawn Drazek, NP at [facility] regarding patient. Patient had relapse with heroin use, last use known 01/03/16 prior to rehabilitation. Seen in office last month and he mentioned urge to use again. We set him up with outpatient services [facility].   Consensus for management of HCV treatment in setting of recent drug relapse is as follows.   -->Let's bring patient in for office visit in 2-3 weeks with LSL, nonurgent spot. -->I will discuss need for urine drug screen to check compliance. DO NOT MENTION TO PATIENT. If his UDS is negative we will consider follow up OV in 2-4 more weeks to check compliance and if deemed appropriate we can start Harvoni at that time.  -->In the meantime, please contact [facility] and ask them to HOLD last shipment of Harvoni. Let them know that patient had unexpected hospitalization which delayed onset of therapy. We will contact them when we are ready for the third and last shipment, and it needs to be sent to us.

## 2016-04-09 ENCOUNTER — Encounter: Payer: Self-pay | Admitting: Gastroenterology

## 2016-04-09 NOTE — Telephone Encounter (Signed)
After reading information below and she stated she will hold his shipment of Harvoni until 11/2 and at that time she will call back to see if we are ready for shipment.

## 2016-04-09 NOTE — Telephone Encounter (Signed)
Moved him to sooner appointment and left him a cell message and also mailed letter

## 2016-04-09 NOTE — Telephone Encounter (Addendum)
I received a call from [facility] in regards to the patient's Harvoni.  She scheduled a shipment for his Harvoni on October 19th to our office.  [phone]

## 2016-04-09 NOTE — Telephone Encounter (Signed)
Stacey please bring patient back for a follow-up with Verlon AuLeslie in about 2-3 weeks

## 2016-04-29 ENCOUNTER — Encounter: Payer: Self-pay | Admitting: Gastroenterology

## 2016-04-29 ENCOUNTER — Telehealth: Payer: Self-pay | Admitting: Gastroenterology

## 2016-04-29 ENCOUNTER — Ambulatory Visit: Payer: Self-pay | Admitting: Gastroenterology

## 2016-04-29 NOTE — Telephone Encounter (Signed)
PT WAS A NO SHOW AND LETTER SENT  °

## 2016-04-29 NOTE — Telephone Encounter (Signed)
Henry FanningJulie, can we touch base with patient via phone and tell him we need for him to keep his office visits if we are going to consider proceeding with Harvoni.   Make another OV in 2 weeks with me.

## 2016-05-02 ENCOUNTER — Encounter: Payer: Self-pay | Admitting: Gastroenterology

## 2016-05-02 ENCOUNTER — Ambulatory Visit (INDEPENDENT_AMBULATORY_CARE_PROVIDER_SITE_OTHER): Payer: Self-pay | Admitting: Gastroenterology

## 2016-05-02 VITALS — BP 127/71 | HR 77 | Temp 98.2°F | Ht 70.0 in | Wt 164.6 lb

## 2016-05-02 DIAGNOSIS — B182 Chronic viral hepatitis C: Secondary | ICD-10-CM

## 2016-05-02 NOTE — Patient Instructions (Signed)
1. Please have your drug screen done today. If negative, we will start Hepatitis C treatment. We will call you when we have results! 2. Keep up the hard work staying clean! Use all the resources you have been provided with fighting this addiction. You can do it!

## 2016-05-02 NOTE — Telephone Encounter (Signed)
After further investigation patient is here now for an ov and already has some Harvoni here.
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
## 2016-05-02 NOTE — Telephone Encounter (Signed)
I tried to reach out to the patient, no answer, lmom

## 2016-05-02 NOTE — Telephone Encounter (Signed)
I received a call from [facility] wanting to schedule a delivery for Harvoni.   We still haven't touched basis with the patient.  She said she will call back in three weeks to see if we are ready for delivery.

## 2016-05-02 NOTE — Assessment & Plan Note (Signed)
30 year old male with history of chronic HCV ascending for follow-up. Please see September 2017 note regarding patient's drug use. As discussed with [facility] previously, he has been brought in today for urine drug screen. He states he has been clean since July. Plan to initiate Harvoni if he passes his drug test. Discussed how to take medication with patient today. Reiterated compliance concerns. If he has any new medications with a prescription or over-the-counter he will notify us prior to starting. Encouraged him to continue to follow-up with RU meetings. Lifelong importance of staying clean going much further than completing hepatitis C treatment. Patient voiced desire to stay clean. Further recommendations to follow pending results.

## 2016-05-03 ENCOUNTER — Other Ambulatory Visit (HOSPITAL_COMMUNITY)
Admission: RE | Admit: 2016-05-03 | Discharge: 2016-05-03 | Disposition: A | Discharge status: Home / Self Care | Payer: Self-pay | Source: Ambulatory Visit | Attending: Gastroenterology | Admitting: Gastroenterology

## 2016-05-03 DIAGNOSIS — B182 Chronic viral hepatitis C: Secondary | ICD-10-CM | POA: Insufficient documentation

## 2016-05-03 LAB — RAPID URINE DRUG SCREEN, HOSP PERFORMED
Amphetamines: NOT DETECTED
BARBITURATES: NOT DETECTED
Benzodiazepines: NOT DETECTED
COCAINE: NOT DETECTED
OPIATES: NOT DETECTED
Tetrahydrocannabinol: NOT DETECTED

## 2016-05-06 ENCOUNTER — Other Ambulatory Visit: Payer: Self-pay | Admitting: Gastroenterology

## 2016-05-06 ENCOUNTER — Encounter: Payer: Self-pay | Admitting: Internal Medicine

## 2016-05-06 DIAGNOSIS — B182 Chronic viral hepatitis C: Secondary | ICD-10-CM

## 2016-05-06 NOTE — Progress Notes (Signed)
Forwarding to Julie.  

## 2016-05-06 NOTE — Progress Notes (Signed)
APPT MADE AND LETTER SENT  °

## 2016-05-06 NOTE — Progress Notes (Signed)
Heroin metabolizes fairly quickly and difficult to detect via drug screen but given we do have negative drugs screen for opiates will proceed with Harvoni treatment.  Please have patient come by and pick up prescription.  Please remind him he needs to take at the same time every day. Do not miss a dose. Do not take new medications without our knowledge as many over the counter and RX drugs can interfere with harvoni. He will need his next labs at four week: CMET, CBC, HCV RNA quantitative. Needs OV in four weeks prior to running out of medication. Needs telephone call via [facility] nursing staff in 2 weeks to make sure he is compliant with medications and inquire about drug use status.

## 2016-05-07 DIAGNOSIS — B182 Chronic viral hepatitis C: Secondary | ICD-10-CM

## 2016-05-07 NOTE — Progress Notes (Signed)
CC'ED TO PCP 

## 2016-05-20 ENCOUNTER — Ambulatory Visit: Payer: Self-pay | Admitting: Gastroenterology

## 2016-06-05 ENCOUNTER — Telehealth: Payer: Self-pay | Admitting: Gastroenterology

## 2016-06-05 ENCOUNTER — Ambulatory Visit: Payer: Self-pay | Admitting: Gastroenterology

## 2016-06-05 NOTE — Telephone Encounter (Signed)
Patient was a no show for visit.  3rd one

## 2016-06-06 NOTE — Telephone Encounter (Signed)
Henry FanningJulie, I see patient rescheduled his OV that he missed yesterday. OV was supposed to be after on harvoni for four weeks. You mentioned that he will have to reapply for the 3rd month of Harvoni. Is there a change he would not be approved if we are able to get this patient started on treatment? Please find out. Thanks!

## 2016-06-07 NOTE — Telephone Encounter (Signed)
When I spoke with patient assistance, they said he would get approved he will just have to fill out the paperwork again and go through the whole process to get it approved.

## 2016-06-14 NOTE — Telephone Encounter (Signed)
We will see if he keeps his next OV before we decide if we need to send back Harvoni and forego treatment at this time.

## 2016-06-27 ENCOUNTER — Encounter: Payer: Self-pay | Admitting: Gastroenterology

## 2016-06-27 ENCOUNTER — Ambulatory Visit: Payer: Self-pay | Admitting: Gastroenterology

## 2016-06-27 ENCOUNTER — Telehealth: Payer: Self-pay | Admitting: Gastroenterology

## 2016-06-27 NOTE — Telephone Encounter (Signed)
Unfortunately patient continues to be noncompliant. Has never started Harvoni.    He no showed for his appt the last two times but I again see he has called and rescheduled his appt for tomorrow.   PLEASE DO NOT SCHEDULE ANY FURTHER APPOINTMENTS WITH PATIENT WITHOUT PROVIDER APPROVAL.   IF HE DOES NOT KEEP HIS APPT TOMORROW, WE WILL NEED TO CONSIDER DISCHARGE FROM PRACTICE AND RETURN HARVONI MEDICATION TO [facility].

## 2016-06-27 NOTE — Telephone Encounter (Signed)
PATIENT WAS A NO SHOW AND LETTER SENT  °

## 2016-06-27 NOTE — Telephone Encounter (Signed)
noted 

## 2016-06-28 ENCOUNTER — Ambulatory Visit (INDEPENDENT_AMBULATORY_CARE_PROVIDER_SITE_OTHER): Payer: Self-pay | Admitting: Gastroenterology

## 2016-06-28 ENCOUNTER — Encounter: Payer: Self-pay | Admitting: Gastroenterology

## 2016-06-28 VITALS — BP 150/84 | HR 99 | Temp 98.0°F | Ht 70.0 in | Wt 159.4 lb

## 2016-06-28 DIAGNOSIS — B182 Chronic viral hepatitis C: Secondary | ICD-10-CM

## 2016-06-28 NOTE — Telephone Encounter (Signed)
Patient made appt and seen today.

## 2016-06-28 NOTE — Assessment & Plan Note (Signed)
Chronic HCV, genotype 1a, F3/F4 scores with plans to treat for 12 weeks of Harvoni. Because of delay in treatment start date, he will need to have his third month of treatment reapproved, papers provided today. Encouraged patient to continue meetings to assist with ongoing drug use cessation. Encouraged no alcohol use. Encouraged compliance with medications, written instructions provided as well. We'll plan to see him back in 3 weeks. At that time we will give him his second shipment of medication and plan for lab work. Patient is to call if he has any questions or concerns. I reiterated multiple times that this is a one-shot deal, he needs to maintain compliance, he needs to avoid drug use as he can be reinfected if he goes back to his risky behaviors. Patient voiced understanding and was a very appreciative with care being provided.

## 2016-06-28 NOTE — Progress Notes (Signed)
cc'ed to pcp °

## 2016-06-28 NOTE — Progress Notes (Signed)
      Primary Care Physician: Jannifer Rodneyhristy Hawks, FNP  Primary Gastroenterologist:  Jonette EvaSandi Fields, MD   Chief Complaint  Patient presents with  . Hepatitis C    to start Harvoni  . Bloated    bloated this morning, "first time I've felt like that"    HPI: Henry Gray D Burges is a 31 y.o. male here to get started on hepatitis C treatment. He is feeling well. He is excited about being treated. Last heroin use July 2017. He states he is feeling well with his sobriety. Denies any GI symptoms or complaints. Currently on no medications.  No current outpatient prescriptions on file.   No current facility-administered medications for this visit.     Allergies as of 06/28/2016 - Review Complete 06/28/2016  Allergen Reaction Noted  . Other  05/02/2016    ROS:  General: Negative for anorexia, weight loss, fever, chills, fatigue, weakness. ENT: Negative for hoarseness, difficulty swallowing , nasal congestion. CV: Negative for chest pain, angina, palpitations, dyspnea on exertion, peripheral edema.  Respiratory: Negative for dyspnea at rest, dyspnea on exertion, cough, sputum, wheezing.  GI: See history of present illness. GU:  Negative for dysuria, hematuria, urinary incontinence, urinary frequency, nocturnal urination.  Endo: Negative for unusual weight change.    Physical Examination:   BP (!) 150/84   Pulse 99   Temp 98 F (36.7 C) (Oral)   Ht 5\' 10"  (1.778 m)   Wt 159 lb 6.4 oz (72.3 kg)   BMI 22.87 kg/m   General: Well-nourished, well-developed in no acute distress.  Eyes: No icterus. Mouth: Oropharyngeal mucosa moist and pink , no lesions erythema or exudate. Lungs: Clear to auscultation bilaterally.  Heart: Regular rate and rhythm, no murmurs rubs or gallops.  Abdomen: Bowel sounds are normal, nontender, nondistended, no hepatosplenomegaly or masses, no abdominal bruits or hernia , no rebound or guarding.   Extremities: No lower extremity edema. No clubbing or  deformities. Neuro: Alert and oriented x 4   Skin: Warm and dry, no jaundice.   Psych: Alert and cooperative, normal mood and affect.  Labs:  Lab Results  Component Value Date   CREATININE 0.88 08/03/2015   BUN 9 08/03/2015   NA 136 08/03/2015   K 3.7 08/03/2015   CL 104 08/03/2015   CO2 25 08/03/2015   Lab Results  Component Value Date   ALT 14 (L) 08/03/2015   AST 21 08/03/2015   ALKPHOS 118 08/03/2015   BILITOT 0.4 08/03/2015   Lab Results  Component Value Date   WBC 9.0 08/03/2015   HGB 13.3 12/12/2015   HCT 44.2 08/03/2015   MCV 84.7 08/03/2015   PLT 312 08/03/2015    Imaging Studies: No results found.

## 2016-06-28 NOTE — Patient Instructions (Signed)
1. Take Harvoni, one pill every day at the same time daily. Do not miss any doses. 2. Do not start any medications without discussing with us first, this includes prescription or over the counter. It is ok to take occasional tylenol and/or advil for pain. 3. You will come back to see me in 3 weeks. At that time I will give you your next month of medication and plan for blood work. IT IS VERY IMPORTANT THAT YOU NOT RUN OUT OF YOUR MEDICATION! 4. Call with any questions or concerns.

## 2016-06-28 NOTE — Telephone Encounter (Signed)
Noted  

## 2016-06-28 NOTE — Telephone Encounter (Signed)
Per Misty StanleyStacey patient had LMOM before we got in this morning saying his car wouldn't start and he was trying to find a ride for his OV at 0830.  Pt called again at 0800 and said his Dad was there and would bring him to his OV. 2 minutes later he calls again and said he was coming but would be about 10 minutes late. I told him "sternly" that if he was not here at 0845 he would not be seen. Pt said he would be here before 0845.

## 2016-07-18 ENCOUNTER — Encounter: Payer: Self-pay | Admitting: Gastroenterology

## 2016-07-18 ENCOUNTER — Ambulatory Visit (INDEPENDENT_AMBULATORY_CARE_PROVIDER_SITE_OTHER): Payer: Self-pay | Admitting: Gastroenterology

## 2016-07-18 VITALS — BP 119/72 | HR 80 | Temp 97.6°F | Ht 70.0 in | Wt 159.8 lb

## 2016-07-18 DIAGNOSIS — B182 Chronic viral hepatitis C: Secondary | ICD-10-CM

## 2016-07-18 NOTE — Progress Notes (Signed)
      Primary Care Physician: Jannifer Rodneyhristy Hawks, FNP  Primary Gastroenterologist:  Jonette EvaSandi Fields, MD   Chief Complaint  Patient presents with  . Hepatitis C    taking Harvoni, states he feels better     HPI: Henry Gray is a 31 y.o. male here for three-week follow-up. Started Harvoni after last office visit. Approximately 3 weeks into therapy. States he has missed 1 dose. Feels well. No GI complaints. Last heroin use July 2017. IV intranasal drug use.  Current Outpatient Prescriptions  Medication Sig Dispense Refill  . Ledipasvir-Sofosbuvir (HARVONI) 90-400 MG TABS Take 1 tablet by mouth daily.     No current facility-administered medications for this visit.     Allergies as of 07/18/2016 - Review Complete 07/18/2016  Allergen Reaction Noted  . Other  05/02/2016    ROS:  General: Negative for anorexia, weight loss, fever, chills, fatigue, weakness. ENT: Negative for hoarseness, difficulty swallowing , nasal congestion. CV: Negative for chest pain, angina, palpitations, dyspnea on exertion, peripheral edema.  Respiratory: Negative for dyspnea at rest, dyspnea on exertion, cough, sputum, wheezing.  GI: See history of present illness. GU:  Negative for dysuria, hematuria, urinary incontinence, urinary frequency, nocturnal urination.  Endo: Negative for unusual weight change.    Physical Examination:   BP 119/72   Pulse 80   Temp 97.6 F (36.4 C) (Oral)   Ht 5\' 10"  (1.778 m)   Wt 159 lb 12.8 oz (72.5 kg)   BMI 22.93 kg/m   General: Well-nourished, well-developed in no acute distress.  Eyes: No icterus. Mouth: Oropharyngeal mucosa moist and pink , no lesions erythema or exudate. Lungs: Clear to auscultation bilaterally.  Heart: Regular rate and rhythm, no murmurs rubs or gallops.  Abdomen: Bowel sounds are normal, nontender, nondistended, no hepatosplenomegaly or masses, no abdominal bruits or hernia , no rebound or guarding.   Extremities: No lower extremity edema. No  clubbing or deformities. Neuro: Alert and oriented x 4   Skin: Warm and dry, no jaundice.   Psych: Alert and cooperative, normal mood and affect.

## 2016-07-18 NOTE — Patient Instructions (Signed)
1. Please return your patient assistance forms as soon as possible so we can get your final month of medication approved. It is very important to complete 12 full weeks of therapy. 2. Please go to have labs done today. We will contact you with results as available.

## 2016-07-19 NOTE — Assessment & Plan Note (Addendum)
Chronic HCV, genotype 1A, F3/F4 scores with plans to treat for 12 weeks with Harvoni. 3 weeks into therapy any feels well. He is due for labs within the next week. He was provided his second month of medication today. He still needs to complete papers to be approved for the third month of therapy. We'll plan to see him back at the end of treatment, otherwise he will call with any questions or concerns.  Reiterated need to avoid illicit drug use and ongoing compliance with medication. Patient voiced understanding.

## 2016-07-19 NOTE — Progress Notes (Signed)
cc'ed to pcp °

## 2016-08-12 ENCOUNTER — Telehealth: Payer: Self-pay

## 2016-08-12 NOTE — Telephone Encounter (Signed)
Pt left Vm that he has been sick with a stomach virus and also not had a way to get the paperwork for Harvoni to us.  I returned his call at [phone] and [facility] and that he needs to get the paperwork here ASAP.

## 2016-08-13 NOTE — Telephone Encounter (Signed)
Pt called and wants to know if he needs OV or labs done. He has completed the paperwork for the Harvoni. He would like to know if he needs to be seen before he brings the paperwork here. Please advise!

## 2016-08-13 NOTE — Telephone Encounter (Signed)
He was supposed to go the lab right after his last OV (four weeks ago). So YES he needs labs and he needs to bring back his paper work TODAY.   He cannot miss a single day of treatment if he wants to be successful at getting rid of the Hep C.

## 2016-08-13 NOTE — Telephone Encounter (Signed)
I called pt and he said he will try to get his mom to bring him by here this afternoon and go to the lab.  He is aware this needs to be done NOW>

## 2016-08-14 ENCOUNTER — Other Ambulatory Visit (HOSPITAL_COMMUNITY)
Admission: RE | Admit: 2016-08-14 | Discharge: 2016-08-14 | Disposition: A | Discharge status: Home / Self Care | Payer: Self-pay | Source: Ambulatory Visit | Attending: Gastroenterology | Admitting: Gastroenterology

## 2016-08-14 DIAGNOSIS — B182 Chronic viral hepatitis C: Secondary | ICD-10-CM | POA: Insufficient documentation

## 2016-08-14 LAB — COMPREHENSIVE METABOLIC PANEL
ALT: 15 U/L — AB (ref 17–63)
AST: 21 U/L (ref 15–41)
Albumin: 3.6 g/dL (ref 3.5–5.0)
Alkaline Phosphatase: 104 U/L (ref 38–126)
Anion gap: 9 (ref 5–15)
BUN: 7 mg/dL (ref 6–20)
CHLORIDE: 103 mmol/L (ref 101–111)
CO2: 25 mmol/L (ref 22–32)
CREATININE: 0.85 mg/dL (ref 0.61–1.24)
Calcium: 9.3 mg/dL (ref 8.9–10.3)
GFR calc Af Amer: >60 mL/min (ref >60)
GFR calc non Af Amer: >60 mL/min (ref >60)
GLUCOSE: 98 mg/dL (ref 65–99)
Potassium: 4.1 mmol/L (ref 3.5–5.1)
SODIUM: 137 mmol/L (ref 135–145)
Total Bilirubin: 0.4 mg/dL (ref 0.3–1.2)
Total Protein: 7.9 g/dL (ref 6.5–8.1)

## 2016-08-14 LAB — CBC WITH DIFFERENTIAL/PLATELET
Basophils Absolute: 0 10*3/uL (ref 0.0–0.1)
Basophils Relative: 0 %
EOS ABS: 0.2 10*3/uL (ref 0.0–0.7)
EOS PCT: 3 %
HCT: 43.1 % (ref 39.0–52.0)
HEMOGLOBIN: 14.3 g/dL (ref 13.0–17.0)
LYMPHS ABS: 1.7 10*3/uL (ref 0.7–4.0)
Lymphocytes Relative: 25 %
MCH: 28.3 pg (ref 26.0–34.0)
MCHC: 33.2 g/dL (ref 30.0–36.0)
MCV: 85.3 fL (ref 78.0–100.0)
MONO ABS: 0.6 10*3/uL (ref 0.1–1.0)
MONOS PCT: 8 %
Neutro Abs: 4.4 10*3/uL (ref 1.7–7.7)
Neutrophils Relative %: 64 %
Platelets: 332 10*3/uL (ref 150–400)
RBC: 5.05 MIL/uL (ref 4.22–5.81)
RDW: 14.5 % (ref 11.5–15.5)
WBC: 6.9 10*3/uL (ref 4.0–10.5)

## 2016-08-14 NOTE — Telephone Encounter (Signed)
noted 

## 2016-08-15 LAB — HCV RNA QUANT: HCV QUANT: NOT DETECTED [IU]/mL (ref >50)

## 2016-08-15 NOTE — Progress Notes (Signed)
HCV RN pending. Other labs normal.

## 2016-08-15 NOTE — Telephone Encounter (Signed)
Pt brought his paperwork back to the office. I called [employer] (pt assistance) spoke with Ferd Glassing and Okey Regal the pharmacist trying to get pt re-enrolled in the program to get his last 4 weeks of treatment. After 29 minutes on the phone, they agreed to get him re-enrolled and to let me give them a verbal order to the pharmacist to get his medication here asap. They have not given me a delivery date at this time.

## 2016-08-15 NOTE — Telephone Encounter (Signed)
Thank you :)

## 2016-08-18 NOTE — Progress Notes (Signed)
HCV RNA negative. He has to COMPLETE Harvoni.  Check HCV RNA quantitative at end of Harvoni.

## 2016-08-19 NOTE — Progress Notes (Signed)
Forwarding to Julie.  

## 2016-08-19 NOTE — Progress Notes (Signed)
Pt is aware of results. He said he has about 5 days of Harvoni left and when does he pick up his next? I told him I would check with Raynelle FanningJulie.

## 2016-08-22 NOTE — Telephone Encounter (Signed)
noted 

## 2016-08-22 NOTE — Telephone Encounter (Signed)
Patients medication has arrived. I have called him- NA- LM on voicemail for him to pick up medication.  Pt called back and I told him it was here and would be at the front desk. He said he would come in the morning to pick it up. I reminded him that we close at 12n tomorrow. He said he understood and would be here before we close.

## 2016-09-30 ENCOUNTER — Telehealth: Payer: Self-pay | Admitting: Family

## 2016-09-30 ENCOUNTER — Telehealth: Payer: Self-pay | Admitting: Gastroenterology

## 2016-09-30 NOTE — Telephone Encounter (Signed)
Patient hadn't been seen here since 2016

## 2016-09-30 NOTE — Telephone Encounter (Signed)
Received phone call from Eustace Pen, NP at [facility]. Patient completed inpatient rehab for opioid abuse/detox from 3/31 to 09/25/16. She is inquiring about his liver status due to desire for suboxone use. She inquired about current liver parameters, Child Pugh score. He is class A, score 5. Patient reported to her that he completed 12 weeks of Harvoni.   PLEASE GET A FOLLOW UP WITH ME, NONURGENT, FOR POST HEP C TREATMENT/LABS.

## 2016-09-30 NOTE — Telephone Encounter (Signed)
Pt is aware and scheduled for OV with Tana Coast, PA on 10/02/2016 at 11:00 Am.

## 2016-10-02 ENCOUNTER — Ambulatory Visit: Payer: Self-pay | Admitting: Gastroenterology

## 2016-10-25 ENCOUNTER — Encounter: Payer: Self-pay | Admitting: Gastroenterology

## 2016-10-25 ENCOUNTER — Ambulatory Visit (INDEPENDENT_AMBULATORY_CARE_PROVIDER_SITE_OTHER): Payer: Self-pay | Admitting: Gastroenterology

## 2016-10-25 VITALS — BP 107/71 | HR 69 | Temp 98.0°F | Ht 70.0 in | Wt 154.8 lb

## 2016-10-25 DIAGNOSIS — B182 Chronic viral hepatitis C: Secondary | ICD-10-CM

## 2016-10-25 NOTE — Progress Notes (Signed)
      Primary Care Physician: Jannifer Rodneyhristy Hawks, FNP  Primary Gastroenterologist:  Jonette EvaSandi Fields, MD   Chief Complaint  Patient presents with  . Hepatitis C    HPI: Henry Gray is a 31 y.o. male here for follow up. Last seen in 06/2016. He completed harvoni in 08/2016. Since we last saw him he started suboxone for opoid addiction. He denies ongoing illicit drug use. He feels well. He is in counseling. No GI complaints.   Labs in Feb with HCV RNA negative.   Current Outpatient Prescriptions  Medication Sig Dispense Refill  . Buprenorphine HCl-Naloxone HCl (SUBOXONE SL) Place under the tongue.     No current facility-administered medications for this visit.     Allergies as of 10/25/2016 - Review Complete 10/25/2016  Allergen Reaction Noted  . Other  05/02/2016    ROS:  General: Negative for anorexia, weight loss, fever, chills, fatigue, weakness. ENT: Negative for hoarseness, difficulty swallowing , nasal congestion. CV: Negative for chest pain, angina, palpitations, dyspnea on exertion, peripheral edema.  Respiratory: Negative for dyspnea at rest, dyspnea on exertion, cough, sputum, wheezing.  GI: See history of present illness. GU:  Negative for dysuria, hematuria, urinary incontinence, urinary frequency, nocturnal urination.  Endo: Negative for unusual weight change.    Physical Examination:   BP 107/71   Pulse 69   Temp 98 F (36.7 C) (Oral)   Ht 5\' 10"  (1.778 m)   Wt 154 lb 12.8 oz (70.2 kg)   BMI 22.21 kg/m   General: Well-nourished, well-developed in no acute distress.  Eyes: No icterus. Mouth: Oropharyngeal mucosa moist and pink , no lesions erythema or exudate. Lungs: Clear to auscultation bilaterally.  Heart: Regular rate and rhythm, no murmurs rubs or gallops.  Abdomen: Bowel sounds are normal, nontender, nondistended, no hepatosplenomegaly or masses, no abdominal bruits or hernia , no rebound or guarding.   Extremities: No lower extremity edema. No  clubbing or deformities. Neuro: Alert and oriented x 4   Skin: Warm and dry, no jaundice.   Psych: Alert and cooperative, normal mood and affect.   Imaging Studies: No results found.

## 2016-10-25 NOTE — Patient Instructions (Signed)
1. Please have your labs done today at [facility]. We will contact you within 5-7 business days with results.  2. Congratulation on your choice to clean up your life! Take it one day at a time. Let us know if you need anything.

## 2016-10-25 NOTE — Assessment & Plan Note (Signed)
31 y/o male with h/o chronic HCV, genotype 1A, F3/F4 scores treated with 12 weeks of Harvonic. He is due for post treatment labs. Reiterated need to avoid illicit drug use.

## 2016-10-28 NOTE — Progress Notes (Signed)
cc'ed to pcp °

## 2017-02-07 NOTE — Progress Notes (Signed)
REVIEWED. CONTACT PT TO COMPLETE HCV RNA.

## 2017-02-10 NOTE — Progress Notes (Signed)
Letter reminder mailed to pt.  

## 2017-04-10 ENCOUNTER — Encounter (HOSPITAL_COMMUNITY): Payer: Self-pay | Admitting: Emergency Medicine

## 2017-04-10 DIAGNOSIS — I10 Essential (primary) hypertension: Secondary | ICD-10-CM | POA: Insufficient documentation

## 2017-04-10 DIAGNOSIS — S81811A Laceration without foreign body, right lower leg, initial encounter: Secondary | ICD-10-CM | POA: Insufficient documentation

## 2017-04-10 DIAGNOSIS — Z79899 Other long term (current) drug therapy: Secondary | ICD-10-CM | POA: Insufficient documentation

## 2017-04-10 DIAGNOSIS — Y9389 Activity, other specified: Secondary | ICD-10-CM | POA: Insufficient documentation

## 2017-04-10 DIAGNOSIS — F17210 Nicotine dependence, cigarettes, uncomplicated: Secondary | ICD-10-CM | POA: Insufficient documentation

## 2017-04-10 DIAGNOSIS — Y929 Unspecified place or not applicable: Secondary | ICD-10-CM | POA: Insufficient documentation

## 2017-04-10 DIAGNOSIS — Z23 Encounter for immunization: Secondary | ICD-10-CM | POA: Insufficient documentation

## 2017-04-10 DIAGNOSIS — J45909 Unspecified asthma, uncomplicated: Secondary | ICD-10-CM | POA: Insufficient documentation

## 2017-04-10 DIAGNOSIS — W260XXA Contact with knife, initial encounter: Secondary | ICD-10-CM | POA: Insufficient documentation

## 2017-04-10 DIAGNOSIS — Y998 Other external cause status: Secondary | ICD-10-CM | POA: Insufficient documentation

## 2017-04-10 NOTE — ED Triage Notes (Signed)
Pt states bent over yesterday with knife in pocket and it cut into right posterior lateral leg. Pt states this was over 24 hrs ago. Pt states got it to stop bleeding but got into shower and started bleeding again. Deep lac with mild oozing of blood noted to this area. Blood is dried to entire right lower leg. Able to move all toes.

## 2017-04-11 ENCOUNTER — Emergency Department (HOSPITAL_COMMUNITY)
Admission: EM | Admit: 2017-04-11 | Discharge: 2017-04-11 | Discharge status: Against Medical Advice | Payer: Self-pay | Attending: Emergency Medicine | Admitting: Emergency Medicine

## 2017-04-11 DIAGNOSIS — S81811A Laceration without foreign body, right lower leg, initial encounter: Secondary | ICD-10-CM

## 2017-04-11 HISTORY — DX: Unspecified convulsions: R56.9

## 2017-04-11 MED ORDER — HYDROGEN PEROXIDE 3 % EX SOLN
CUTANEOUS | Status: AC
  Administered 2017-04-11: 01:00:00
  Filled 2017-04-11: qty 473

## 2017-04-11 MED ORDER — LIDOCAINE-EPINEPHRINE (PF) 1 %-1:200000 IJ SOLN
10.0000 mL | Freq: Once | INTRAMUSCULAR | Status: AC
  Administered 2017-04-11: 10 mL
  Filled 2017-04-11: qty 30

## 2017-04-11 MED ORDER — ONDANSETRON 8 MG PO TBDP
8.0000 mg | ORAL_TABLET | Freq: Once | ORAL | Status: AC
  Administered 2017-04-11: 8 mg via ORAL
  Filled 2017-04-11: qty 1

## 2017-04-11 MED ORDER — TETANUS-DIPHTH-ACELL PERTUSSIS 5-2.5-18.5 LF-MCG/0.5 IM SUSP
0.5000 mL | Freq: Once | INTRAMUSCULAR | Status: AC
  Administered 2017-04-11: 0.5 mL via INTRAMUSCULAR
  Filled 2017-04-11: qty 0.5

## 2017-04-11 NOTE — ED Notes (Signed)
Pt encouraged to stay; pt stating he hates needles and is having a panic attack; pt informed that if he leaves he will need to check back in; pt seen leaving the department with steady gait
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
## 2017-09-15 ENCOUNTER — Other Ambulatory Visit: Payer: Self-pay

## 2017-09-15 ENCOUNTER — Emergency Department (HOSPITAL_COMMUNITY): Payer: Self-pay

## 2017-09-15 ENCOUNTER — Emergency Department (HOSPITAL_COMMUNITY)
Admission: EM | Admit: 2017-09-15 | Discharge: 2017-09-15 | Disposition: A | Discharge status: Home / Self Care | Payer: Self-pay | Attending: Emergency Medicine | Admitting: Emergency Medicine

## 2017-09-15 ENCOUNTER — Encounter (HOSPITAL_COMMUNITY): Payer: Self-pay | Admitting: Emergency Medicine

## 2017-09-15 DIAGNOSIS — J45909 Unspecified asthma, uncomplicated: Secondary | ICD-10-CM | POA: Insufficient documentation

## 2017-09-15 DIAGNOSIS — L03116 Cellulitis of left lower limb: Secondary | ICD-10-CM | POA: Insufficient documentation

## 2017-09-15 DIAGNOSIS — I10 Essential (primary) hypertension: Secondary | ICD-10-CM | POA: Insufficient documentation

## 2017-09-15 DIAGNOSIS — F17210 Nicotine dependence, cigarettes, uncomplicated: Secondary | ICD-10-CM | POA: Insufficient documentation

## 2017-09-15 LAB — CBC WITH DIFFERENTIAL/PLATELET
Basophils Absolute: 0 10*3/uL (ref 0.0–0.1)
Basophils Relative: 0 %
EOS PCT: 0 %
Eosinophils Absolute: 0 10*3/uL (ref 0.0–0.7)
HCT: 40.6 % (ref 39.0–52.0)
HEMOGLOBIN: 13.9 g/dL (ref 13.0–17.0)
LYMPHS PCT: 13 %
Lymphs Abs: 1.1 10*3/uL (ref 0.7–4.0)
MCH: 28.4 pg (ref 26.0–34.0)
MCHC: 34.2 g/dL (ref 30.0–36.0)
MCV: 82.9 fL (ref 78.0–100.0)
Monocytes Absolute: 0.6 10*3/uL (ref 0.1–1.0)
Monocytes Relative: 7 %
NEUTROS PCT: 80 %
Neutro Abs: 6.6 10*3/uL (ref 1.7–7.7)
PLATELETS: 264 10*3/uL (ref 150–400)
RBC: 4.9 MIL/uL (ref 4.22–5.81)
RDW: 13.1 % (ref 11.5–15.5)
WBC: 8.4 10*3/uL (ref 4.0–10.5)

## 2017-09-15 LAB — COMPREHENSIVE METABOLIC PANEL
ALT: 21 U/L (ref 17–63)
AST: 32 U/L (ref 15–41)
Albumin: 3.6 g/dL (ref 3.5–5.0)
Alkaline Phosphatase: 139 U/L — ABNORMAL HIGH (ref 38–126)
Anion gap: 10 (ref 5–15)
BILIRUBIN TOTAL: 0.8 mg/dL (ref 0.3–1.2)
BUN: 7 mg/dL (ref 6–20)
CHLORIDE: 96 mmol/L — AB (ref 101–111)
CO2: 25 mmol/L (ref 22–32)
Calcium: 8.9 mg/dL (ref 8.9–10.3)
Creatinine, Ser: 0.9 mg/dL (ref 0.61–1.24)
Glucose, Bld: 117 mg/dL — ABNORMAL HIGH (ref 65–99)
POTASSIUM: 3.6 mmol/L (ref 3.5–5.1)
Sodium: 131 mmol/L — ABNORMAL LOW (ref 135–145)
Total Protein: 7.7 g/dL (ref 6.5–8.1)

## 2017-09-15 LAB — I-STAT CG4 LACTIC ACID, ED
LACTIC ACID, VENOUS: 2.19 mmol/L — AB (ref 0.5–1.9)
LACTIC ACID, VENOUS: 1.24 mmol/L (ref 0.5–1.9)

## 2017-09-15 MED ORDER — CEPHALEXIN 250 MG PO CAPS
500.0000 mg | ORAL_CAPSULE | Freq: Once | ORAL | Status: AC
  Administered 2017-09-15: 500 mg via ORAL
  Filled 2017-09-15: qty 2

## 2017-09-15 MED ORDER — KETOROLAC TROMETHAMINE 30 MG/ML IJ SOLN
30.0000 mg | Freq: Once | INTRAMUSCULAR | Status: AC
  Administered 2017-09-15: 30 mg via INTRAVENOUS
  Filled 2017-09-15: qty 1

## 2017-09-15 MED ORDER — DOXYCYCLINE HYCLATE 100 MG PO CAPS: 100.0000 mg | ORAL_CAPSULE | Freq: Three times a day (TID) | ORAL | 0 refills | Status: DC

## 2017-09-15 MED ORDER — VANCOMYCIN HCL IN DEXTROSE 1-5 GM/200ML-% IV SOLN
1000.0000 mg | Freq: Once | INTRAVENOUS | Status: AC
  Administered 2017-09-15: 1000 mg via INTRAVENOUS
  Filled 2017-09-15: qty 200

## 2017-09-15 MED ORDER — DOXYCYCLINE HYCLATE 100 MG PO TABS
100.0000 mg | ORAL_TABLET | Freq: Once | ORAL | Status: AC
  Administered 2017-09-15: 100 mg via ORAL
  Filled 2017-09-15: qty 1

## 2017-09-15 MED ORDER — CEPHALEXIN 500 MG PO CAPS: 500.0000 mg | ORAL_CAPSULE | Freq: Four times a day (QID) | ORAL | 0 refills | Status: DC

## 2017-09-15 NOTE — ED Notes (Signed)
Notified triage nurse istat lactic results. 

## 2017-09-15 NOTE — ED Notes (Signed)
IV nurses at bedside attempting to insert peripheral IV .

## 2017-09-15 NOTE — ED Triage Notes (Addendum)
Pt c/o left lower leg abscess that started at 2am yesterday that has gradually gotten worse. Pt doesn't know what caused it. Leg red, warm to touch, swollen, cap refill and pulses intact. Pt reports 8/10 burning pain. Pt reports being extremely sleepy throughout the day.

## 2017-09-15 NOTE — ED Notes (Signed)
Nurse attempted to insert peripheral IV but unable to establish IV access due to scarred veins at both arms , pt. refused further attempts by RN .
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
## 2017-09-15 NOTE — ED Notes (Signed)
2nd RN attempted to insert peripheral IV with no success , IV team consult ordered.

## 2017-09-15 NOTE — Discharge Instructions (Addendum)
Please start taking the antibiotics as prescribed.  Return to the ER in 2 days for recheck. Return to the ER immediately if the redness starts spreading towards your thigh rapidly, or you start developing high fevers or chills.

## 2017-11-20 ENCOUNTER — Emergency Department (HOSPITAL_COMMUNITY)
Admission: EM | Admit: 2017-11-20 | Discharge: 2017-11-20 | Disposition: A | Discharge status: Home / Self Care | Payer: Self-pay | Attending: Emergency Medicine | Admitting: Emergency Medicine

## 2017-11-20 ENCOUNTER — Other Ambulatory Visit: Payer: Self-pay

## 2017-11-20 ENCOUNTER — Encounter (HOSPITAL_COMMUNITY): Payer: Self-pay

## 2017-11-20 DIAGNOSIS — Y929 Unspecified place or not applicable: Secondary | ICD-10-CM | POA: Insufficient documentation

## 2017-11-20 DIAGNOSIS — Y939 Activity, unspecified: Secondary | ICD-10-CM | POA: Insufficient documentation

## 2017-11-20 DIAGNOSIS — W25XXXA Contact with sharp glass, initial encounter: Secondary | ICD-10-CM | POA: Insufficient documentation

## 2017-11-20 DIAGNOSIS — S91011A Laceration without foreign body, right ankle, initial encounter: Secondary | ICD-10-CM | POA: Insufficient documentation

## 2017-11-20 DIAGNOSIS — Y999 Unspecified external cause status: Secondary | ICD-10-CM | POA: Insufficient documentation

## 2017-11-20 DIAGNOSIS — Z5321 Procedure and treatment not carried out due to patient leaving prior to being seen by health care provider: Secondary | ICD-10-CM | POA: Insufficient documentation

## 2017-11-20 NOTE — ED Notes (Signed)
Pt ambulatory to waiting room with steady gate.

## 2017-11-20 NOTE — ED Notes (Signed)
Upon entering pt room pt standing in room with blood leaking into a plastic bag he had wrapped around his foot. This nurse removed plastic bag and placed a non stick pad on the wound and wrapped in gauze.

## 2017-11-20 NOTE — ED Notes (Signed)
Pt came to nursing station stating that he needed to leave and "come back later." Pt advised he would have to sign out AMA and check back in when he returned. Pt explained the risks of leaving AMA. Pt verbalized the understanding of those risks. Pt left without signing the topaz signature pad.
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
## 2017-11-20 NOTE — ED Triage Notes (Signed)
Patient states that it has been bleeding a lot.

## 2018-05-19 ENCOUNTER — Ambulatory Visit (INDEPENDENT_AMBULATORY_CARE_PROVIDER_SITE_OTHER): Payer: Self-pay | Admitting: Urology

## 2018-05-19 DIAGNOSIS — I861 Scrotal varices: Secondary | ICD-10-CM

## 2019-05-27 DIAGNOSIS — I861 Scrotal varices: Secondary | ICD-10-CM | POA: Insufficient documentation

## 2019-09-08 IMAGING — CR DG FOOT 2V*L*
2 series · 2 of 2 positions shown · non-contrast
Comparison: None.

CLINICAL DATA: Left foot swelling and erythema.

EXAM:
LEFT FOOT - 2 VIEW

[foot ap]
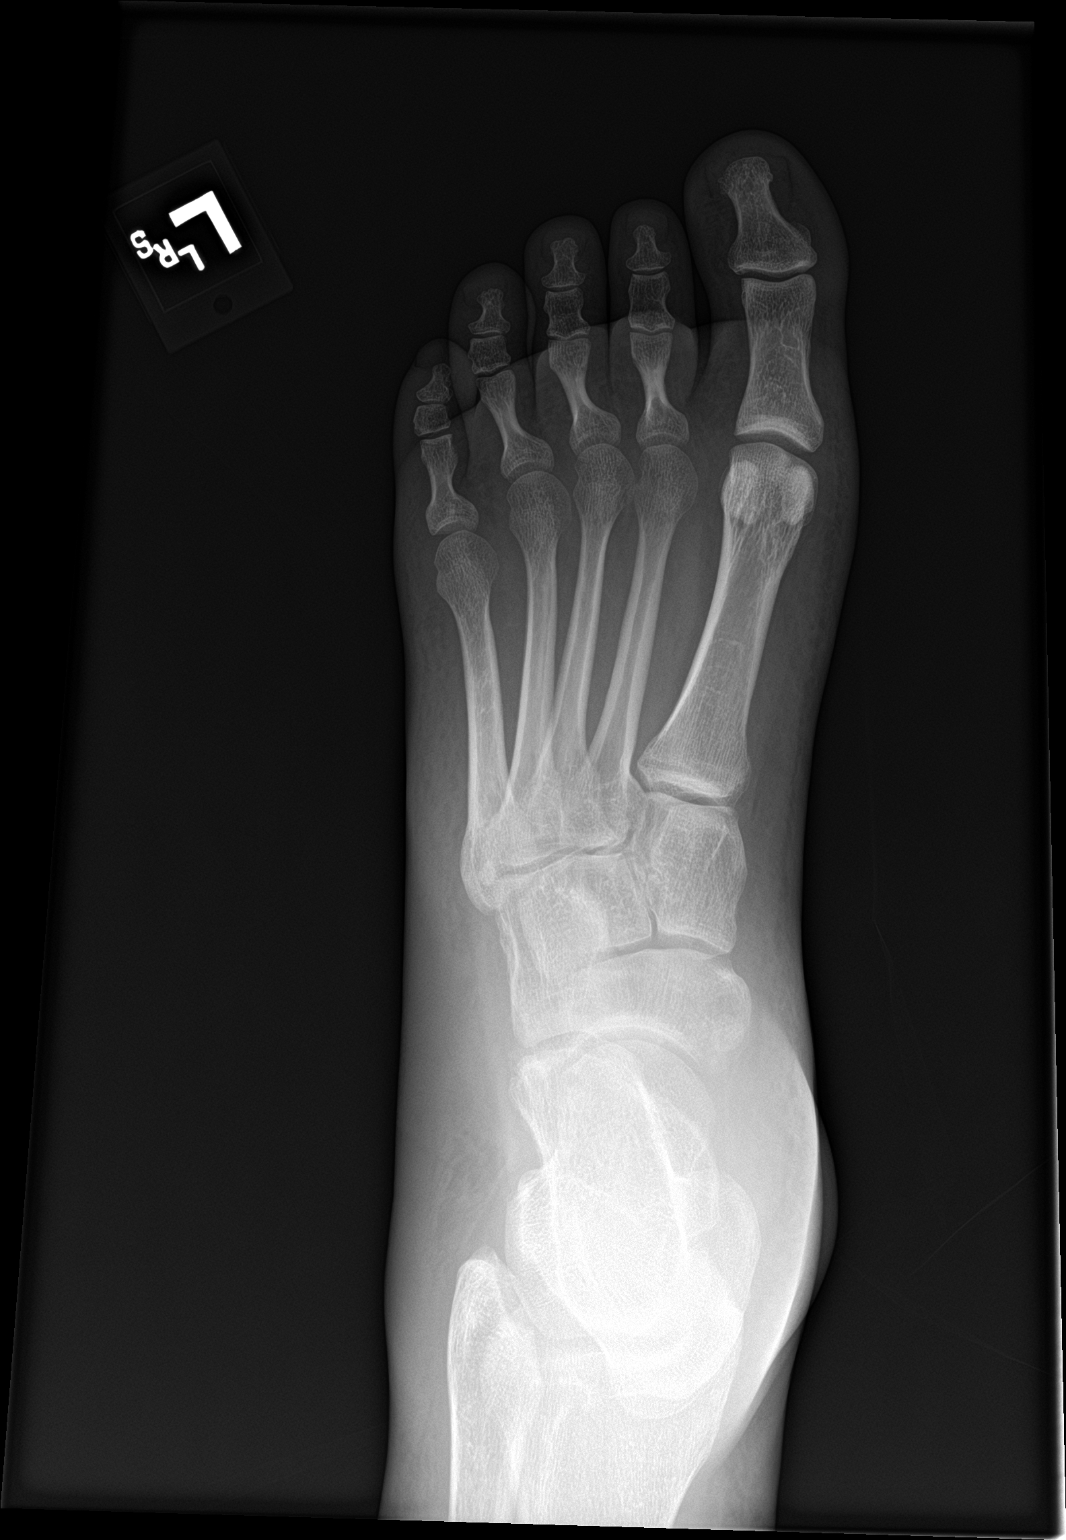

[foot lat]
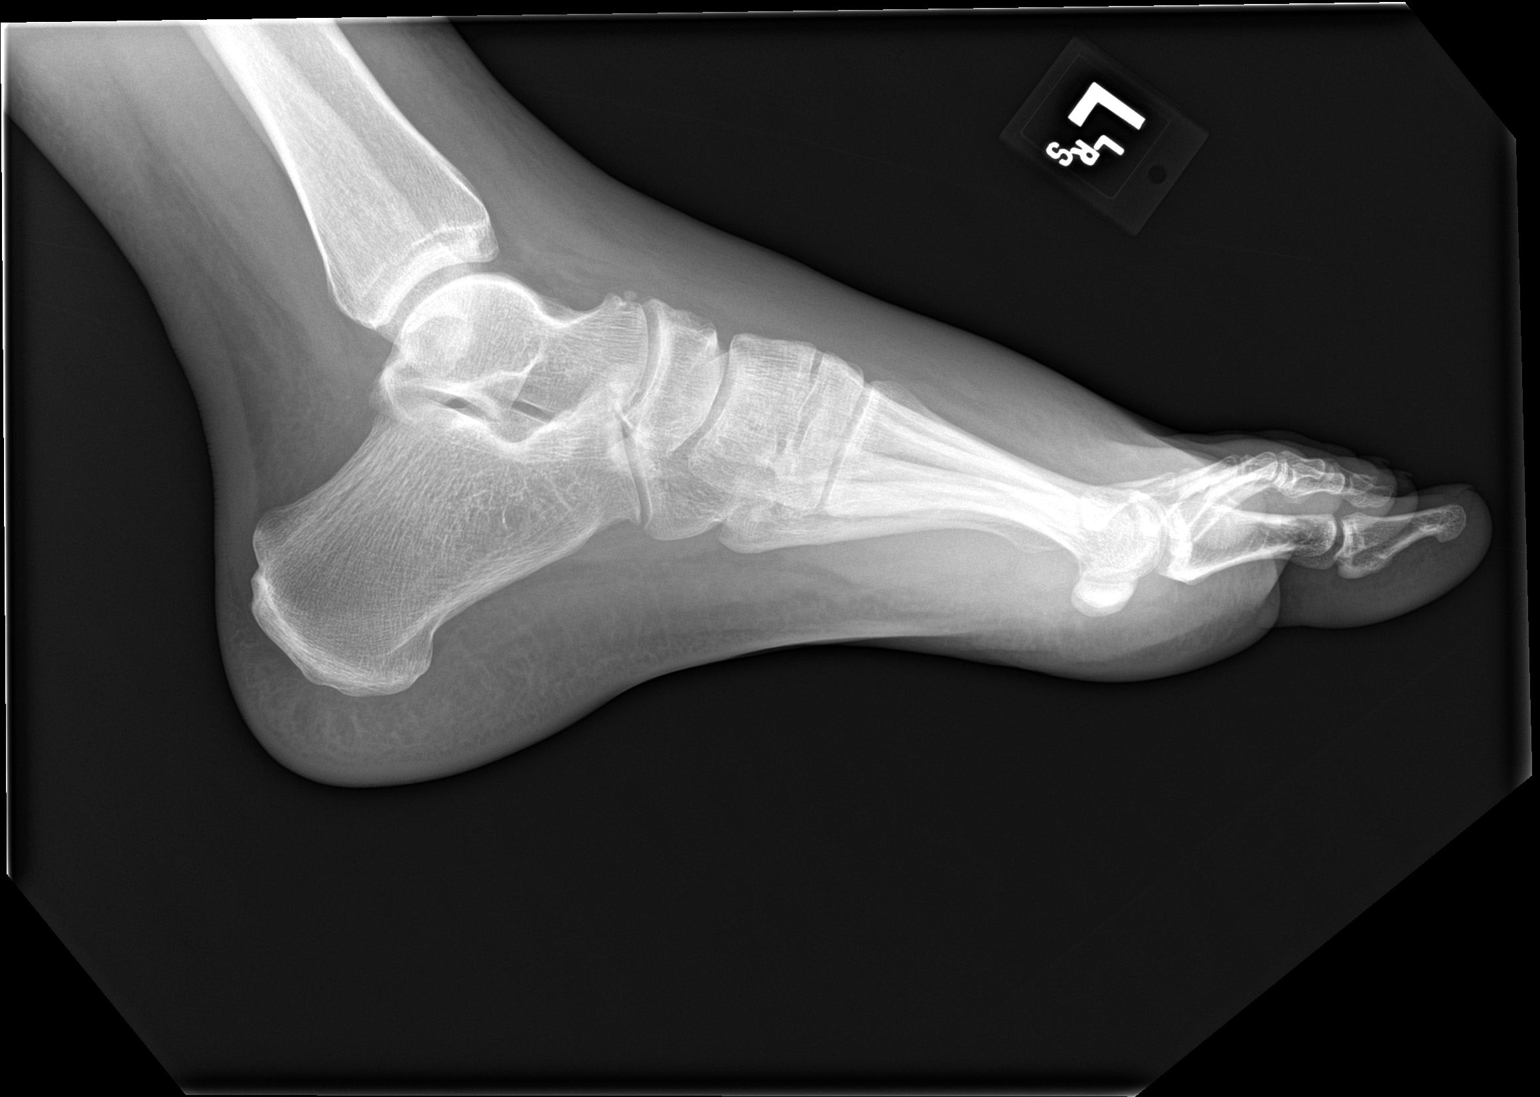

[2 of 2 positions shown; findings below may reference images not displayed]

FINDINGS: There is no evidence of fracture or dislocation. There is no
evidence of arthropathy or other focal bone abnormality. Soft
tissues along the dorsum of the foot are swollen. No subcutaneous
gas. No radiopaque foreign object..
IMPRESSION: Dorsal left foot soft tissue swelling without subcutaneous gas.

## 2020-12-18 ENCOUNTER — Telehealth: Payer: Self-pay | Admitting: *Deleted

## 2020-12-18 NOTE — Telephone Encounter (Signed)
12/18/2020  Needs hospital follow-up with PCP for cellulitis. Patient/mother advised that he hasn't been seen in > 3 yrs and would be considered a new patient. Explained new patient procedure and they plan to pickup the paperwork and schedule an appointment within the next 1-2 weeks.   Taking antibiotic as prescribed. Redness and swelling have improved. No drainage at present.   Advised to return to ED with any new or worsening symptoms.

## 2022-06-04 ENCOUNTER — Ambulatory Visit (INDEPENDENT_AMBULATORY_CARE_PROVIDER_SITE_OTHER): Payer: 59 | Admitting: Family Medicine

## 2022-06-04 ENCOUNTER — Encounter: Payer: Self-pay | Admitting: Family Medicine

## 2022-06-04 VITALS — BP 146/103 | HR 79 | Temp 98.4°F | Ht 70.0 in | Wt 192.0 lb

## 2022-06-04 DIAGNOSIS — J4520 Mild intermittent asthma, uncomplicated: Secondary | ICD-10-CM | POA: Diagnosis not present

## 2022-06-04 DIAGNOSIS — I10 Essential (primary) hypertension: Secondary | ICD-10-CM

## 2022-06-04 DIAGNOSIS — F339 Major depressive disorder, recurrent, unspecified: Secondary | ICD-10-CM | POA: Diagnosis not present

## 2022-06-04 DIAGNOSIS — I861 Scrotal varices: Secondary | ICD-10-CM | POA: Diagnosis not present

## 2022-06-04 DIAGNOSIS — R7989 Other specified abnormal findings of blood chemistry: Secondary | ICD-10-CM

## 2022-06-04 DIAGNOSIS — F1911 Other psychoactive substance abuse, in remission: Secondary | ICD-10-CM

## 2022-06-04 DIAGNOSIS — B182 Chronic viral hepatitis C: Secondary | ICD-10-CM

## 2022-06-04 DIAGNOSIS — Z87438 Personal history of other diseases of male genital organs: Secondary | ICD-10-CM

## 2022-06-04 DIAGNOSIS — R69 Illness, unspecified: Secondary | ICD-10-CM | POA: Diagnosis not present

## 2022-06-04 MED ORDER — LISINOPRIL 5 MG PO TABS: 5.0000 mg | ORAL_TABLET | Freq: Every day | ORAL | 3 refills | Status: DC

## 2022-06-04 MED ORDER — ALBUTEROL SULFATE HFA 108 (90 BASE) MCG/ACT IN AERS: 2.0000 | INHALATION_SPRAY | Freq: Four times a day (QID) | RESPIRATORY_TRACT | 2 refills | Status: AC | PRN

## 2022-06-04 NOTE — Patient Instructions (Signed)
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
## 2022-06-05 ENCOUNTER — Encounter: Payer: Self-pay | Admitting: Gastroenterology

## 2022-06-05 ENCOUNTER — Encounter: Payer: Self-pay | Admitting: Family Medicine

## 2022-06-05 NOTE — Progress Notes (Signed)
error 

## 2022-06-06 LAB — HCV RNA BY PCR, QN RFX GENO: HCV Quant Baseline: NOT DETECTED IU/mL

## 2022-06-06 LAB — CMP14+EGFR
ALT: 21 IU/L (ref 0–44)
AST: 24 IU/L (ref 0–40)
Albumin/Globulin Ratio: 1.4 (ref 1.2–2.2)
Albumin: 4.6 g/dL (ref 4.1–5.1)
Alkaline Phosphatase: 127 IU/L — ABNORMAL HIGH (ref 44–121)
BUN/Creatinine Ratio: 17 (ref 9–20)
BUN: 14 mg/dL (ref 6–20)
Bilirubin Total: 0.3 mg/dL (ref 0.0–1.2)
CO2: 23 mmol/L (ref 20–29)
Calcium: 9.3 mg/dL (ref 8.7–10.2)
Chloride: 100 mmol/L (ref 96–106)
Creatinine, Ser: 0.81 mg/dL (ref 0.76–1.27)
Globulin, Total: 3.2 g/dL (ref 1.5–4.5)
Glucose: 89 mg/dL (ref 70–99)
Potassium: 4.5 mmol/L (ref 3.5–5.2)
Sodium: 138 mmol/L (ref 134–144)
Total Protein: 7.8 g/dL (ref 6.0–8.5)
eGFR: 116 mL/min/{1.73_m2} (ref >59)

## 2022-06-06 LAB — CBC WITH DIFFERENTIAL/PLATELET
Basophils Absolute: 0 10*3/uL (ref 0.0–0.2)
Basos: 0 %
EOS (ABSOLUTE): 0.1 10*3/uL (ref 0.0–0.4)
Eos: 1 %
Hematocrit: 43.8 % (ref 37.5–51.0)
Hemoglobin: 14.5 g/dL (ref 13.0–17.7)
Immature Grans (Abs): 0 10*3/uL (ref 0.0–0.1)
Immature Granulocytes: 0 %
Lymphocytes Absolute: 1.3 10*3/uL (ref 0.7–3.1)
Lymphs: 17 %
MCH: 28.2 pg (ref 26.6–33.0)
MCHC: 33.1 g/dL (ref 31.5–35.7)
MCV: 85 fL (ref 79–97)
Monocytes Absolute: 0.6 10*3/uL (ref 0.1–0.9)
Monocytes: 8 %
Neutrophils Absolute: 5.7 10*3/uL (ref 1.4–7.0)
Neutrophils: 74 %
Platelets: 328 10*3/uL (ref 150–450)
RBC: 5.14 x10E6/uL (ref 4.14–5.80)
RDW: 12.8 % (ref 11.6–15.4)
WBC: 7.6 10*3/uL (ref 3.4–10.8)

## 2022-06-06 LAB — LIPID PANEL
Chol/HDL Ratio: 3.1 ratio (ref 0.0–5.0)
Cholesterol, Total: 168 mg/dL (ref 100–199)
HDL: 54 mg/dL (ref >39)
LDL Chol Calc (NIH): 88 mg/dL (ref 0–99)
Triglycerides: 151 mg/dL — ABNORMAL HIGH (ref 0–149)
VLDL Cholesterol Cal: 26 mg/dL (ref 5–40)

## 2022-06-06 LAB — THYROID PANEL WITH TSH
Free Thyroxine Index: 3.9 (ref 1.2–4.9)
T3 Uptake Ratio: 49 % — ABNORMAL HIGH (ref 24–39)
T4, Total: 8 ug/dL (ref 4.5–12.0)
TSH: 0.975 u[IU]/mL (ref 0.450–4.500)

## 2022-06-06 LAB — HIV ANTIBODY (ROUTINE TESTING W REFLEX): HIV Screen 4th Generation wRfx: NONREACTIVE

## 2022-06-18 ENCOUNTER — Encounter: Payer: Self-pay | Admitting: Family Medicine

## 2022-06-18 ENCOUNTER — Ambulatory Visit (INDEPENDENT_AMBULATORY_CARE_PROVIDER_SITE_OTHER): Payer: 59 | Admitting: Family Medicine

## 2022-06-18 VITALS — BP 124/77 | HR 76 | Temp 98.6°F | Ht 70.0 in | Wt 191.0 lb

## 2022-06-18 DIAGNOSIS — N529 Male erectile dysfunction, unspecified: Secondary | ICD-10-CM

## 2022-06-18 DIAGNOSIS — I10 Essential (primary) hypertension: Secondary | ICD-10-CM

## 2022-06-18 MED ORDER — TADALAFIL 10 MG PO TABS: 10.0000 mg | ORAL_TABLET | ORAL | 1 refills | Status: DC | PRN
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
## 2022-06-18 NOTE — Patient Instructions (Addendum)
[facility] - set up account today.     Goal BP:  For patients younger than 60: Goal BP < 140/90. For patients 60 and older: Goal BP < 150/90. For patients with diabetes: Goal BP < 140/90.  Take your medications faithfully as prescribed. Maintain a healthy weight. Get at least 150 minutes of aerobic exercise per week. Minimize salt intake, less than 2000 mg per day. Minimize alcohol intake.  DASH Eating Plan DASH stands for "Dietary Approaches to Stop Hypertension." The DASH eating plan is a healthy eating plan that has been shown to reduce high blood pressure (hypertension). Additional health benefits may include reducing the risk of type 2 diabetes mellitus, heart disease, and stroke. The DASH eating plan may also help with weight loss.  WHAT DO I NEED TO KNOW ABOUT THE DASH EATING PLAN? For the DASH eating plan, you will follow these general guidelines: Choose foods with a percent daily value for sodium of less than 5% (as listed on the food label). Use salt-free seasonings or herbs instead of table salt or sea salt. Check with your health care provider or pharmacist before using salt substitutes. Eat lower-sodium products, often labeled as "lower sodium" or "no salt added." Eat fresh foods. Eat more vegetables, fruits, and low-fat dairy products. Choose whole grains. Look for the word "whole" as the first word in the ingredient list. Choose fish and skinless chicken or [place] more often than red meat. Limit fish, poultry, and meat to 6 oz (170 g) each day. Limit sweets, desserts, sugars, and sugary drinks. Choose heart-healthy fats. Limit cheese to 1 oz (28 g) per day. Eat more home-cooked food and less restaurant, buffet, and fast food. Limit fried foods. Cook foods using methods other than frying. Limit canned vegetables. If you do use them, rinse them well to decrease the sodium. When eating at a restaurant, ask that your food be prepared with less salt, or no salt if  possible.  WHAT FOODS CAN I EAT? Seek help from a dietitian for individual calorie needs.  Grains Whole grain or whole wheat bread. Brown rice. Whole grain or whole wheat pasta. Quinoa, bulgur, and whole grain cereals. Low-sodium cereals. Corn or whole wheat flour tortillas. Whole grain cornbread. Whole grain crackers. Low-sodium crackers.  Vegetables Fresh or frozen vegetables (raw, steamed, roasted, or grilled). Low-sodium or reduced-sodium tomato and vegetable juices. Low-sodium or reduced-sodium tomato sauce and paste. Low-sodium or reduced-sodium canned vegetables.   Fruits All fresh, canned (in natural juice), or frozen fruits.  Meat and Other Protein Products Ground beef (85% or leaner), grass-fed beef, or beef trimmed of fat. Skinless chicken or [place]. Ground chicken or [place]. Pork trimmed of fat. All fish and seafood. Eggs. Dried beans, peas, or lentils. Unsalted nuts and seeds. Unsalted canned beans.  Dairy Low-fat dairy products, such as skim or 1% milk, 2% or reduced-fat cheeses, low-fat ricotta or cottage cheese, or plain low-fat yogurt. Low-sodium or reduced-sodium cheeses.  Fats and Oils Tub margarines without trans fats. Light or reduced-fat mayonnaise and salad dressings (reduced sodium). Avocado. Safflower, olive, or canola oils. Natural peanut or almond butter.  Other Unsalted popcorn and pretzels. The items listed above may not be a complete list of recommended foods or beverages. Contact your dietitian for more options.  WHAT FOODS ARE NOT RECOMMENDED?  Grains White bread. White pasta. White rice. Refined cornbread. Bagels and croissants. Crackers that contain trans fat.  Vegetables Creamed or fried vegetables. Vegetables in a cheese sauce. Regular canned vegetables.  Regular canned tomato sauce and paste. Regular tomato and vegetable juices.  Fruits Dried fruits. Canned fruit in light or heavy syrup. Fruit juice.  Meat and Other Protein Products Fatty  cuts of meat. Ribs, chicken wings, bacon, sausage, bologna, salami, chitterlings, fatback, hot dogs, bratwurst, and packaged luncheon meats. Salted nuts and seeds. Canned beans with salt.  Dairy Whole or 2% milk, cream, half-and-half, and cream cheese. Whole-fat or sweetened yogurt. Full-fat cheeses or blue cheese. Nondairy creamers and whipped toppings. Processed cheese, cheese spreads, or cheese curds.  Condiments Onion and garlic salt, seasoned salt, table salt, and sea salt. Canned and packaged gravies. Worcestershire sauce. Tartar sauce. Barbecue sauce. Teriyaki sauce. Soy sauce, including reduced sodium. Steak sauce. Fish sauce. Oyster sauce. Cocktail sauce. Horseradish. Ketchup and mustard. Meat flavorings and tenderizers. Bouillon cubes. Hot sauce. Tabasco sauce. Marinades. Taco seasonings. Relishes.  Fats and Oils Butter, stick margarine, lard, shortening, ghee, and bacon fat. Coconut, palm kernel, or palm oils. Regular salad dressings.  Other Pickles and olives. Salted popcorn and pretzels.  The items listed above may not be a complete list of foods and beverages to avoid. Contact your dietitian for more information.  WHERE CAN I FIND MORE INFORMATION? National Heart, [employer], and [employer] Institute: [email or web address] Document Released: 05/30/2011 Document Revised: 10/25/2013 Document Reviewed: 04/14/2013 [facility] Patient Information 2015 [place], [place]. This information is not intended to replace advice given to you by your health care provider. Make sure you discuss any questions you have with your health care provider.   I think that you would greatly benefit from seeing a nutritionist.  If you are interested, please call Dr. Gerilyn Pilgrim at [phone] to schedule an appointment.

## 2022-06-19 LAB — BMP8+EGFR
BUN/Creatinine Ratio: 15 (ref 9–20)
BUN: 14 mg/dL (ref 6–20)
CO2: 23 mmol/L (ref 20–29)
Calcium: 9.3 mg/dL (ref 8.7–10.2)
Chloride: 101 mmol/L (ref 96–106)
Creatinine, Ser: 0.92 mg/dL (ref 0.76–1.27)
Glucose: 95 mg/dL (ref 70–99)
Potassium: 4.1 mmol/L (ref 3.5–5.2)
Sodium: 138 mmol/L (ref 134–144)
eGFR: 110 mL/min/{1.73_m2} (ref >59)

## 2022-06-26 ENCOUNTER — Telehealth: Payer: Self-pay | Admitting: Family Medicine

## 2022-06-26 NOTE — Telephone Encounter (Signed)
Patient said that he is having side effects from tadalafil (CIALIS) 10 MG tablet wants to discuss other options. Side effects include nausea, back pain and headache.Please call back and advise.

## 2022-06-27 NOTE — Addendum Note (Signed)
Addended by: Baruch Gouty on: 06/27/2022 11:04 AM   Modules accepted: Orders

## 2022-06-27 NOTE — Telephone Encounter (Signed)
Patient aware and verbalizes understanding. 

## 2022-07-17 ENCOUNTER — Ambulatory Visit: Payer: 59 | Admitting: Gastroenterology

## 2022-07-22 ENCOUNTER — Encounter: Payer: Self-pay | Admitting: Gastroenterology

## 2022-08-20 ENCOUNTER — Ambulatory Visit: Payer: 59 | Admitting: Gastroenterology

## 2022-08-20 ENCOUNTER — Encounter: Payer: Self-pay | Admitting: Gastroenterology

## 2022-09-18 ENCOUNTER — Ambulatory Visit: Payer: Medicaid Other | Admitting: Family Medicine

## 2022-09-18 ENCOUNTER — Encounter: Payer: Self-pay | Admitting: Family Medicine

## 2022-09-18 VITALS — BP 129/81 | HR 76 | Temp 97.8°F | Ht 70.0 in | Wt 203.4 lb

## 2022-09-18 DIAGNOSIS — L03311 Cellulitis of abdominal wall: Secondary | ICD-10-CM | POA: Diagnosis not present

## 2022-09-18 DIAGNOSIS — N529 Male erectile dysfunction, unspecified: Secondary | ICD-10-CM

## 2022-09-18 DIAGNOSIS — I10 Essential (primary) hypertension: Secondary | ICD-10-CM

## 2022-09-18 MED ORDER — SILDENAFIL CITRATE 50 MG PO TABS: 50.0000 mg | ORAL_TABLET | Freq: Every day | ORAL | 1 refills | Status: DC | PRN

## 2022-09-18 MED ORDER — DOXYCYCLINE HYCLATE 100 MG PO TABS: 100.0000 mg | ORAL_TABLET | Freq: Two times a day (BID) | ORAL | 0 refills | Status: AC

## 2022-09-18 MED ORDER — LISINOPRIL 5 MG PO TABS: 5.0000 mg | ORAL_TABLET | Freq: Every day | ORAL | 3 refills | Status: DC
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
## 2022-09-19 LAB — CBC WITH DIFFERENTIAL/PLATELET
Basophils Absolute: 0 10*3/uL (ref 0.0–0.2)
Basos: 0 %
EOS (ABSOLUTE): 0.1 10*3/uL (ref 0.0–0.4)
Eos: 2 %
Hematocrit: 38.1 % (ref 37.5–51.0)
Hemoglobin: 12.6 g/dL — ABNORMAL LOW (ref 13.0–17.7)
Immature Grans (Abs): 0 10*3/uL (ref 0.0–0.1)
Immature Granulocytes: 1 %
Lymphocytes Absolute: 1.5 10*3/uL (ref 0.7–3.1)
Lymphs: 20 %
MCH: 28.4 pg (ref 26.6–33.0)
MCHC: 33.1 g/dL (ref 31.5–35.7)
MCV: 86 fL (ref 79–97)
Monocytes Absolute: 0.7 10*3/uL (ref 0.1–0.9)
Monocytes: 9 %
Neutrophils Absolute: 5.2 10*3/uL (ref 1.4–7.0)
Neutrophils: 68 %
Platelets: 332 10*3/uL (ref 150–450)
RBC: 4.43 x10E6/uL (ref 4.14–5.80)
RDW: 13.2 % (ref 11.6–15.4)
WBC: 7.5 10*3/uL (ref 3.4–10.8)

## 2022-09-19 LAB — CMP14+EGFR
ALT: 18 IU/L (ref 0–44)
AST: 20 IU/L (ref 0–40)
Albumin/Globulin Ratio: 1.3 (ref 1.2–2.2)
Albumin: 3.7 g/dL — ABNORMAL LOW (ref 4.1–5.1)
Alkaline Phosphatase: 135 IU/L — ABNORMAL HIGH (ref 44–121)
BUN/Creatinine Ratio: 7 — ABNORMAL LOW (ref 9–20)
BUN: 6 mg/dL (ref 6–20)
Bilirubin Total: <0.2 mg/dL (ref 0.0–1.2)
CO2: 22 mmol/L (ref 20–29)
Calcium: 8.4 mg/dL — ABNORMAL LOW (ref 8.7–10.2)
Chloride: 103 mmol/L (ref 96–106)
Creatinine, Ser: 0.81 mg/dL (ref 0.76–1.27)
Globulin, Total: 2.8 g/dL (ref 1.5–4.5)
Glucose: 97 mg/dL (ref 70–99)
Potassium: 4.5 mmol/L (ref 3.5–5.2)
Sodium: 140 mmol/L (ref 134–144)
Total Protein: 6.5 g/dL (ref 6.0–8.5)
eGFR: 116 mL/min/{1.73_m2} (ref >59)

## 2022-10-03 ENCOUNTER — Ambulatory Visit: Payer: Self-pay | Admitting: Family Medicine

## 2023-02-05 ENCOUNTER — Encounter: Payer: Self-pay | Admitting: *Deleted

## 2023-07-03 ENCOUNTER — Telehealth (INDEPENDENT_AMBULATORY_CARE_PROVIDER_SITE_OTHER): Payer: Medicaid Other | Admitting: Family Medicine

## 2023-07-03 DIAGNOSIS — Z91199 Patient's noncompliance with other medical treatment and regimen due to unspecified reason: Secondary | ICD-10-CM

## 2023-07-03 NOTE — Progress Notes (Signed)
 Attempted to connect several times, pt did not show to appointment.

## 2023-07-22 ENCOUNTER — Encounter: Payer: Self-pay | Admitting: Family Medicine

## 2023-07-22 ENCOUNTER — Ambulatory Visit: Payer: Medicaid Other | Admitting: Family Medicine

## 2023-07-22 ENCOUNTER — Ambulatory Visit: Payer: Medicaid Other | Admitting: Nurse Practitioner

## 2023-07-22 VITALS — BP 120/70 | HR 80 | Temp 97.3°F | Ht 70.0 in | Wt 209.0 lb

## 2023-07-22 DIAGNOSIS — F339 Major depressive disorder, recurrent, unspecified: Secondary | ICD-10-CM

## 2023-07-22 DIAGNOSIS — J4520 Mild intermittent asthma, uncomplicated: Secondary | ICD-10-CM

## 2023-07-22 DIAGNOSIS — N529 Male erectile dysfunction, unspecified: Secondary | ICD-10-CM | POA: Diagnosis not present

## 2023-07-22 DIAGNOSIS — R110 Nausea: Secondary | ICD-10-CM

## 2023-07-22 DIAGNOSIS — I10 Essential (primary) hypertension: Secondary | ICD-10-CM

## 2023-07-22 DIAGNOSIS — F1911 Other psychoactive substance abuse, in remission: Secondary | ICD-10-CM | POA: Diagnosis not present

## 2023-07-22 DIAGNOSIS — Z91030 Bee allergy status: Secondary | ICD-10-CM

## 2023-07-22 MED ORDER — EPINEPHRINE 0.3 MG/0.3ML IJ SOAJ: 0.3000 mg | INTRAMUSCULAR | 6 refills | Status: AC | PRN

## 2023-07-22 MED ORDER — LISINOPRIL 5 MG PO TABS: 5.0000 mg | ORAL_TABLET | Freq: Every day | ORAL | 3 refills | Status: DC

## 2023-07-22 MED ORDER — PROMETHAZINE HCL 25 MG PO TABS: 25.0000 mg | ORAL_TABLET | Freq: Three times a day (TID) | ORAL | 0 refills | Status: DC | PRN

## 2023-07-22 MED ORDER — SILDENAFIL CITRATE 50 MG PO TABS: 50.0000 mg | ORAL_TABLET | Freq: Every day | ORAL | 0 refills | Status: DC | PRN
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
## 2023-07-22 NOTE — Progress Notes (Signed)
mei

## 2023-07-23 LAB — CBC WITH DIFFERENTIAL/PLATELET
Basophils Absolute: 0 10*3/uL (ref 0.0–0.2)
Basos: 0 %
EOS (ABSOLUTE): 0.1 10*3/uL (ref 0.0–0.4)
Eos: 3 %
Hematocrit: 39.3 % (ref 37.5–51.0)
Hemoglobin: 13.3 g/dL (ref 13.0–17.7)
Immature Grans (Abs): 0 10*3/uL (ref 0.0–0.1)
Immature Granulocytes: 0 %
Lymphocytes Absolute: 1.7 10*3/uL (ref 0.7–3.1)
Lymphs: 35 %
MCH: 28.7 pg (ref 26.6–33.0)
MCHC: 33.8 g/dL (ref 31.5–35.7)
MCV: 85 fL (ref 79–97)
Monocytes Absolute: 0.5 10*3/uL (ref 0.1–0.9)
Monocytes: 11 %
Neutrophils Absolute: 2.5 10*3/uL (ref 1.4–7.0)
Neutrophils: 51 %
Platelets: 285 10*3/uL (ref 150–450)
RBC: 4.63 x10E6/uL (ref 4.14–5.80)
RDW: 14 % (ref 11.6–15.4)
WBC: 4.8 10*3/uL (ref 3.4–10.8)

## 2023-07-23 LAB — CMP14+EGFR
ALT: 14 [IU]/L (ref 0–44)
AST: 15 [IU]/L (ref 0–40)
Albumin: 3.9 g/dL — ABNORMAL LOW (ref 4.1–5.1)
Alkaline Phosphatase: 116 [IU]/L (ref 44–121)
BUN/Creatinine Ratio: 32 — ABNORMAL HIGH (ref 9–20)
BUN: 22 mg/dL — ABNORMAL HIGH (ref 6–20)
Bilirubin Total: 0.2 mg/dL (ref 0.0–1.2)
CO2: 21 mmol/L (ref 20–29)
Calcium: 8.6 mg/dL — ABNORMAL LOW (ref 8.7–10.2)
Chloride: 106 mmol/L (ref 96–106)
Creatinine, Ser: 0.69 mg/dL — ABNORMAL LOW (ref 0.76–1.27)
Globulin, Total: 2.6 g/dL (ref 1.5–4.5)
Glucose: 92 mg/dL (ref 70–99)
Potassium: 4.5 mmol/L (ref 3.5–5.2)
Sodium: 140 mmol/L (ref 134–144)
Total Protein: 6.5 g/dL (ref 6.0–8.5)
eGFR: 121 mL/min/{1.73_m2} (ref >59)

## 2023-11-25 ENCOUNTER — Other Ambulatory Visit: Payer: Self-pay | Admitting: Family Medicine

## 2023-11-25 DIAGNOSIS — N529 Male erectile dysfunction, unspecified: Secondary | ICD-10-CM

## 2024-01-27 ENCOUNTER — Encounter: Payer: Self-pay | Admitting: Family Medicine

## 2024-01-27 ENCOUNTER — Ambulatory Visit: Payer: Medicaid Other | Admitting: Family Medicine

## 2024-01-27 VITALS — BP 145/102 | HR 90 | Temp 97.3°F | Ht 70.0 in | Wt 193.6 lb

## 2024-01-27 DIAGNOSIS — Z0000 Encounter for general adult medical examination without abnormal findings: Secondary | ICD-10-CM

## 2024-01-27 DIAGNOSIS — Z0001 Encounter for general adult medical examination with abnormal findings: Secondary | ICD-10-CM | POA: Diagnosis not present

## 2024-01-27 DIAGNOSIS — F339 Major depressive disorder, recurrent, unspecified: Secondary | ICD-10-CM

## 2024-01-27 DIAGNOSIS — B182 Chronic viral hepatitis C: Secondary | ICD-10-CM | POA: Diagnosis not present

## 2024-01-27 DIAGNOSIS — F1911 Other psychoactive substance abuse, in remission: Secondary | ICD-10-CM

## 2024-01-27 DIAGNOSIS — Z136 Encounter for screening for cardiovascular disorders: Secondary | ICD-10-CM

## 2024-01-27 DIAGNOSIS — I10 Essential (primary) hypertension: Secondary | ICD-10-CM

## 2024-01-27 DIAGNOSIS — N529 Male erectile dysfunction, unspecified: Secondary | ICD-10-CM

## 2024-01-27 DIAGNOSIS — Z130 Encounter for screening for diseases of the blood and blood-forming organs and certain disorders involving the immune mechanism: Secondary | ICD-10-CM

## 2024-01-27 MED ORDER — LISINOPRIL 10 MG PO TABS: 10.0000 mg | ORAL_TABLET | Freq: Every day | ORAL | 3 refills | Status: AC

## 2024-01-27 MED ORDER — SILDENAFIL CITRATE 50 MG PO TABS
50.0000 mg | ORAL_TABLET | Freq: Every day | ORAL | 1 refills | Status: AC | PRN
Start: 2024-01-27 — End: ?
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
## 2024-01-27 NOTE — Patient Instructions (Signed)
 Goal BP:  For patients younger than 60: Goal BP < 140/90. For patients 60 and older: Goal BP < 150/90. For patients with diabetes: Goal BP < 140/90.  Take your medications faithfully as prescribed. Maintain a healthy weight. Get at least 150 minutes of aerobic exercise per week. Minimize salt intake, less than 2000 mg per day. Minimize alcohol intake.  DASH Eating Plan DASH stands for "Dietary Approaches to Stop Hypertension." The DASH eating plan is a healthy eating plan that has been shown to reduce high blood pressure (hypertension). Additional health benefits may include reducing the risk of type 2 diabetes mellitus, heart disease, and stroke. The DASH eating plan may also help with weight loss.  WHAT DO I NEED TO KNOW ABOUT THE DASH EATING PLAN? For the DASH eating plan, you will follow these general guidelines: Choose foods with a percent daily value for sodium of less than 5% (as listed on the food label). Use salt-free seasonings or herbs instead of table salt or sea salt. Check with your health care provider or pharmacist before using salt substitutes. Eat lower-sodium products, often labeled as "lower sodium" or "no salt added." Eat fresh foods. Eat more vegetables, fruits, and low-fat dairy products. Choose whole grains. Look for the word "whole" as the first word in the ingredient list. Choose fish and skinless chicken or [place] more often than red meat. Limit fish, poultry, and meat to 6 oz (170 g) each day. Limit sweets, desserts, sugars, and sugary drinks. Choose heart-healthy fats. Limit cheese to 1 oz (28 g) per day. Eat more home-cooked food and less restaurant, buffet, and fast food. Limit fried foods. Cook foods using methods other than frying. Limit canned vegetables. If you do use them, rinse them well to decrease the sodium. When eating at a restaurant, ask that your food be prepared with less salt, or no salt if possible.  WHAT FOODS CAN I EAT? Seek help from  a dietitian for individual calorie needs.  Grains Whole grain or whole wheat bread. Brown rice. Whole grain or whole wheat pasta. Quinoa, bulgur, and whole grain cereals. Low-sodium cereals. Corn or whole wheat flour tortillas. Whole grain cornbread. Whole grain crackers. Low-sodium crackers.  Vegetables Fresh or frozen vegetables (raw, steamed, roasted, or grilled). Low-sodium or reduced-sodium tomato and vegetable juices. Low-sodium or reduced-sodium tomato sauce and paste. Low-sodium or reduced-sodium canned vegetables.   Fruits All fresh, canned (in natural juice), or frozen fruits.  Meat and Other Protein Products Ground beef (85% or leaner), grass-fed beef, or beef trimmed of fat. Skinless chicken or [place]. Ground chicken or [place]. Pork trimmed of fat. All fish and seafood. Eggs. Dried beans, peas, or lentils. Unsalted nuts and seeds. Unsalted canned beans.  Dairy Low-fat dairy products, such as skim or 1% milk, 2% or reduced-fat cheeses, low-fat ricotta or cottage cheese, or plain low-fat yogurt. Low-sodium or reduced-sodium cheeses.  Fats and Oils Tub margarines without trans fats. Light or reduced-fat mayonnaise and salad dressings (reduced sodium). Avocado. Safflower, olive, or canola oils. Natural peanut or almond butter.  Other Unsalted popcorn and pretzels. The items listed above may not be a complete list of recommended foods or beverages. Contact your dietitian for more options.  WHAT FOODS ARE NOT RECOMMENDED?  Grains White bread. White pasta. White rice. Refined cornbread. Bagels and croissants. Crackers that contain trans fat.  Vegetables Creamed or fried vegetables. Vegetables in a cheese sauce. Regular canned vegetables. Regular canned tomato sauce and paste. Regular tomato and vegetable juices.  Fruits Dried fruits. Canned fruit in light or heavy syrup. Fruit juice.  Meat and Other Protein Products Fatty cuts of meat. Ribs, chicken wings, bacon, sausage,  bologna, salami, chitterlings, fatback, hot dogs, bratwurst, and packaged luncheon meats. Salted nuts and seeds. Canned beans with salt.  Dairy Whole or 2% milk, cream, half-and-half, and cream cheese. Whole-fat or sweetened yogurt. Full-fat cheeses or blue cheese. Nondairy creamers and whipped toppings. Processed cheese, cheese spreads, or cheese curds.  Condiments Onion and garlic salt, seasoned salt, table salt, and sea salt. Canned and packaged gravies. Worcestershire sauce. Tartar sauce. Barbecue sauce. Teriyaki sauce. Soy sauce, including reduced sodium. Steak sauce. Fish sauce. Oyster sauce. Cocktail sauce. Horseradish. Ketchup and mustard. Meat flavorings and tenderizers. Bouillon cubes. Hot sauce. Tabasco sauce. Marinades. Taco seasonings. Relishes.  Fats and Oils Butter, stick margarine, lard, shortening, ghee, and bacon fat. Coconut, palm kernel, or palm oils. Regular salad dressings.  Other Pickles and olives. Salted popcorn and pretzels.  The items listed above may not be a complete list of foods and beverages to avoid. Contact your dietitian for more information.  WHERE CAN I FIND MORE INFORMATION? National Heart, Lung, and Blood Institute: [email or web address] Document Released: 05/30/2011 Document Revised: 10/25/2013 Document Reviewed: 04/14/2013 [facility] Patient Information 2015 [place], [place]. This information is not intended to replace advice given to you by your health care provider. Make sure you discuss any questions you have with your health care provider.   I think that you would greatly benefit from seeing a nutritionist.  If you are interested, please call Dr. Gerilyn Pilgrim at [phone] to schedule an appointment.

## 2024-01-28 ENCOUNTER — Encounter (INDEPENDENT_AMBULATORY_CARE_PROVIDER_SITE_OTHER): Payer: Self-pay | Admitting: *Deleted

## 2024-02-16 ENCOUNTER — Ambulatory Visit (INDEPENDENT_AMBULATORY_CARE_PROVIDER_SITE_OTHER): Admitting: Gastroenterology

## 2024-02-17 ENCOUNTER — Encounter (INDEPENDENT_AMBULATORY_CARE_PROVIDER_SITE_OTHER): Payer: Self-pay | Admitting: Gastroenterology

## 2024-05-07 ENCOUNTER — Ambulatory Visit: Payer: Self-pay
